# Patient Record
Sex: Male | Born: 1985 | Hispanic: Yes | Smoking: Never smoker
Health system: Southern US, Community
[De-identification: ages and names within clinical notes are randomized; demographics above are authoritative.]

## PROBLEM LIST (undated history)

## (undated) DIAGNOSIS — R29898 Other symptoms and signs involving the musculoskeletal system: Secondary | ICD-10-CM

## (undated) HISTORY — PX: NO PAST SURGERIES: SHX2092

---

## 2017-10-18 ENCOUNTER — Emergency Department (HOSPITAL_COMMUNITY): Payer: Self-pay

## 2017-10-18 ENCOUNTER — Encounter (HOSPITAL_COMMUNITY): Payer: Self-pay | Admitting: Emergency Medicine

## 2017-10-18 ENCOUNTER — Other Ambulatory Visit: Payer: Self-pay | Admitting: Gerontology

## 2017-10-18 ENCOUNTER — Ambulatory Visit (INDEPENDENT_AMBULATORY_CARE_PROVIDER_SITE_OTHER): Payer: Self-pay

## 2017-10-18 ENCOUNTER — Inpatient Hospital Stay (HOSPITAL_COMMUNITY)
Admission: EM | Admit: 2017-10-18 | Discharge: 2017-10-21 | DRG: 074 | Disposition: A | Discharge status: Home / Self Care | Payer: Self-pay | Attending: Internal Medicine | Admitting: Internal Medicine

## 2017-10-18 DIAGNOSIS — R200 Anesthesia of skin: Secondary | ICD-10-CM

## 2017-10-18 DIAGNOSIS — R202 Paresthesia of skin: Secondary | ICD-10-CM | POA: Diagnosis present

## 2017-10-18 DIAGNOSIS — M545 Low back pain: Secondary | ICD-10-CM | POA: Diagnosis present

## 2017-10-18 DIAGNOSIS — R29898 Other symptoms and signs involving the musculoskeletal system: Secondary | ICD-10-CM | POA: Diagnosis present

## 2017-10-18 DIAGNOSIS — G629 Polyneuropathy, unspecified: Principal | ICD-10-CM | POA: Diagnosis present

## 2017-10-18 DIAGNOSIS — W19XXXA Unspecified fall, initial encounter: Secondary | ICD-10-CM

## 2017-10-18 HISTORY — DX: Other symptoms and signs involving the musculoskeletal system: R29.898

## 2017-10-18 LAB — COMPREHENSIVE METABOLIC PANEL
ALK PHOS: 69 U/L (ref 38–126)
ALT: 25 U/L (ref 17–63)
ANION GAP: 8 (ref 5–15)
AST: 23 U/L (ref 15–41)
Albumin: 4.4 g/dL (ref 3.5–5.0)
BILIRUBIN TOTAL: 0.7 mg/dL (ref 0.3–1.2)
BUN: 13 mg/dL (ref 6–20)
CO2: 24 mmol/L (ref 22–32)
CREATININE: 0.9 mg/dL (ref 0.61–1.24)
Calcium: 8.9 mg/dL (ref 8.9–10.3)
Chloride: 107 mmol/L (ref 101–111)
Glucose, Bld: 96 mg/dL (ref 65–99)
Potassium: 3.8 mmol/L (ref 3.5–5.1)
Sodium: 139 mmol/L (ref 135–145)
TOTAL PROTEIN: 8.2 g/dL — AB (ref 6.5–8.1)

## 2017-10-18 LAB — CBC
HEMATOCRIT: 44.6 % (ref 39.0–52.0)
Hemoglobin: 15.8 g/dL (ref 13.0–17.0)
MCH: 31.5 pg (ref 26.0–34.0)
MCHC: 35.4 g/dL (ref 30.0–36.0)
MCV: 89 fL (ref 78.0–100.0)
Platelets: 246 10*3/uL (ref 150–400)
RBC: 5.01 MIL/uL (ref 4.22–5.81)
RDW: 13.2 % (ref 11.5–15.5)
WBC: 7.9 10*3/uL (ref 4.0–10.5)

## 2017-10-18 LAB — RAPID URINE DRUG SCREEN, HOSP PERFORMED
AMPHETAMINES: NOT DETECTED
BENZODIAZEPINES: NOT DETECTED
Barbiturates: NOT DETECTED
COCAINE: NOT DETECTED
OPIATES: NOT DETECTED
Tetrahydrocannabinol: NOT DETECTED

## 2017-10-18 LAB — SEDIMENTATION RATE: Sed Rate: 4 mm/hr (ref 0–16)

## 2017-10-18 LAB — C-REACTIVE PROTEIN

## 2017-10-18 LAB — BRAIN NATRIURETIC PEPTIDE: B NATRIURETIC PEPTIDE 5: 3.3 pg/mL (ref 0.0–100.0)

## 2017-10-18 MED ORDER — IOPAMIDOL (ISOVUE-300) INJECTION 61%
INTRAVENOUS | Status: AC
  Administered 2017-10-18: 100 mL
  Filled 2017-10-18: qty 100

## 2017-10-18 MED ORDER — ACETAMINOPHEN 650 MG RE SUPP: 650.0000 mg | RECTAL | Status: DC | PRN

## 2017-10-18 MED ORDER — GADOBENATE DIMEGLUMINE 529 MG/ML IV SOLN
20.0000 mL | Freq: Once | INTRAVENOUS | Status: AC | PRN
  Administered 2017-10-18: 20 mL via INTRAVENOUS

## 2017-10-18 MED ORDER — ACETAMINOPHEN 160 MG/5ML PO SOLN: 650.0000 mg | ORAL | Status: DC | PRN

## 2017-10-18 MED ORDER — METHOCARBAMOL 750 MG PO TABS
750.0000 mg | ORAL_TABLET | Freq: Every evening | ORAL | Status: DC | PRN
  Administered 2017-10-20: 750 mg via ORAL
  Filled 2017-10-18 (×2): qty 1

## 2017-10-18 MED ORDER — ACETAMINOPHEN 325 MG PO TABS: 650.0000 mg | ORAL_TABLET | Freq: Four times a day (QID) | ORAL | Status: DC | PRN

## 2017-10-18 MED ORDER — KETOROLAC TROMETHAMINE 15 MG/ML IJ SOLN
15.0000 mg | Freq: Once | INTRAMUSCULAR | Status: AC
  Administered 2017-10-18: 15 mg via INTRAMUSCULAR
  Filled 2017-10-18: qty 1

## 2017-10-18 MED ORDER — SENNOSIDES-DOCUSATE SODIUM 8.6-50 MG PO TABS: 1.0000 | ORAL_TABLET | Freq: Every evening | ORAL | Status: DC | PRN

## 2017-10-18 MED ORDER — ENOXAPARIN SODIUM 40 MG/0.4ML ~~LOC~~ SOLN
40.0000 mg | SUBCUTANEOUS | Status: DC
  Administered 2017-10-19 – 2017-10-20 (×2): 40 mg via SUBCUTANEOUS
  Filled 2017-10-18 (×3): qty 0.4

## 2017-10-18 MED ORDER — METHOCARBAMOL 500 MG PO TABS
1000.0000 mg | ORAL_TABLET | Freq: Once | ORAL | Status: AC
  Administered 2017-10-18: 1000 mg via ORAL
  Filled 2017-10-18: qty 2

## 2017-10-18 MED ORDER — ACETAMINOPHEN 325 MG PO TABS
650.0000 mg | ORAL_TABLET | ORAL | Status: DC | PRN
  Administered 2017-10-19 – 2017-10-20 (×3): 650 mg via ORAL
  Filled 2017-10-18 (×3): qty 2

## 2017-10-18 MED ORDER — MELOXICAM 7.5 MG PO TABS
7.5000 mg | ORAL_TABLET | Freq: Two times a day (BID) | ORAL | Status: DC | PRN
  Administered 2017-10-19: 7.5 mg via ORAL
  Filled 2017-10-18: qty 1

## 2017-10-18 NOTE — ED Notes (Signed)
Bed: WA16 Expected date:  Expected time:  Means of arrival:  Comments: Triage 9 

## 2017-10-18 NOTE — ED Notes (Signed)
This nurse and EDPA attempted to ambulate patient. Patient complained of pain and "no control" to left leg. Patient states he cannot put weight on left leg.

## 2017-10-18 NOTE — Consult Note (Addendum)
   [facility] Tele[facility]urology Consult Services  Date of Service: 10/18/17  Impression:  Acute back pain, B LE pain and severe L LE weakness - etiology unclear. The most likely localization based upon exam is L sciatic nerve or distal lumbosacral plexus. However, right hemisphere stroke, distal cervical/thoracic cord, and cauda equina cannot be ruled out. DDx myelopathy (compressive vs inflammatory vs vascular, such as dural AF fistula, which can cause transient LE weakness) vs pelvic mass vs idiopathic lumboplexitis. Symptoms came on more suddenly than would be expected for vasculitis, and GBS would not have resolution of the right leg weakness this quickly.   Recommendations:  MRI C/T spine and MRI pelvis stat to rule out compressive lesion. If above is negative, consider starting prednisone 60 mg daily pending further workup. Neuro consult EMG/NCS Consider LP based upon the results of the above.   ---------------------------------------------------------------------  CC: leg weakness  History of Present Illness:  32 yo man who was at work and he bent over and stood up, then developed painful tingling in the right leg which spread to the left. Within a few seconds, his legs became weak. Currently, the left leg is much weaker than the right. No numbness.   Diagnostic Testing: MRI L spine with only mild degenerative changes.  Vital Signs:   Vitals:   10/18/17 1838 10/18/17 1839  BP: 129/69   Pulse: 87 87  Resp: 18   Temp:    SpO2: 99% 99%     Exam:  Mental Status:  Awake, alert, oriented  Naming: Intact Repetition: Intact   Speech: fluent  Cranial Nerves:  Pupils: Equal round and reactive to light Extraocular movements: Intact in all cardinal gaze Ptosis: Absent Visual fields: Intact to finger counting Facial sensation: Intact to pin and light touch Facial movements: Intact and symmetric    Motor Exam:  No drift in B UE; strength in the right is normal. L LE is  much weaker, with weak antigravity strength in the L HF and no movement below the knee. He is unable to plantar/dorsiflex his left foot.  Tremor/Abnormal Movements:  Resting tremor: Absent Intention tremor: Absent Postural tremor: Absent  Sensory Exam:   Light touch: Intact    Coordination:   Finger to nose: Intact   Medical Decision Making:  - Extensive number of diagnosis or management options are considered above.   - Extensive amount of complex data reviewed.   - High risk of complication and/or morbidity or mortality are associated with differential diagnostic considerations above.  - There may be uncertain outcome and increased probability of prolonged functional impairment or high probability of severe prolonged functional impairment associated with some of these differential diagnosis.   Medical Data Reviewed:  1.Data reviewed include clinical labs, radiology,  Medical Tests;   2.Tests results discussed w/performing or interpreting physician;   3.Obtaining/reviewing old medical records;  4.Obtaining case history from another source;  5.Independent review of image, tracing or specimen.    Patient was informed the Neurology Consult would happen via telehealth (remote video) and consented to receiving care in this manner.

## 2017-10-18 NOTE — ED Notes (Signed)
Attempted IV x 2 blood returned twice, no IV access . Labs drawn .

## 2017-10-18 NOTE — ED Triage Notes (Signed)
Patient c/o bilateral leg pain radiating to lower back after "bedning over this morning at work." Denies changes in bowel or bladder and numbness and tingling. Movement to bilateral legs. Reports pain with movement.

## 2017-10-18 NOTE — ED Notes (Signed)
Pt speaking with Tele[facility] at this time. 

## 2017-10-18 NOTE — ED Notes (Signed)
Attempted to go into room to update vital signs but patient needed to use bathroom; will re-attempt once patient has finished urinating.

## 2017-10-18 NOTE — ED Provider Notes (Signed)
Medical screening examination/treatment/procedure(s) were conducted as a shared visit with non-physician practitioner(s) and myself.  I personally evaluated the patient during the encounter. Briefly, the patient is a 32 year old male here with new, acute onset left leg weakness, loss of reflexes, and now fasciculations.  Lumbar spine without signs of cauda equina or cord compression.  Patient with significant distal weakness of the left lower extremity, bilateral paresthesias, and loss of reflexes.  Concern for acute radiculopathy, though negative imaging raises concern for possible demyelinating or inflammatory process.  Patient does have a recent viral illness.  Given that he is unable to walk with ongoing, severe left distal lower extremity weakness, will consult neurology.   EKG Interpretation None          Ryan PollackIsaacs, Ryan Chagnon, MD 10/18/17 1807

## 2017-10-18 NOTE — Consult Note (Signed)
NEURO HOSPITALIST CONSULT NOTE   Requestig physician: Dr. Erma HeritageIsaacs  Reason for Consult: Bilateral lower extremity pain and severe LLE weakness of unclear etiology  History obtained from:  Patient and Chart     HPI:                                                                                                                                          Buckner MaltaJuan Carlos Perez Morales is an 32 y.o. male who was at work when he experienced sudden onset of painful tingling in the right leg which spread to the left after he bent over and stood up. Within a few seconds, his legs became weak. [facility] his left leg was much weaker than the right. The patient denied sensory numbness. A TeleNeurology consult was obtained.   Impression per TeleNeurology consultant was as follows: "Acute back pain, B LE pain and severe L LE weakness - etiology unclear. The most likely localization based upon exam is L sciatic nerve or distal lumbosacral plexus. However, right hemisphere stroke, distal cervical/thoracic cord, and cauda equina cannot be ruled out. DDx myelopathy (compressive vs inflammatory vs vascular, such as dural AF fistula, which can cause transient LE weakness) vs pelvic mass vs idiopathic lumboplexitis. Symptoms came on more suddenly than would be expected for vasculitis, and GBS would not have resolution of the right leg weakness this quickly."   MRI lumbar spine was unremarkable. An MRI of the cervical and thoracic spine was then ordered STAT to rule out compressive lesion, with no acute findings. CT of pelvis was then ordered to rule out lumbosacral plexus compression. An MRI was also ordered to rule out a right ACA stroke.     History reviewed. No pertinent past medical history.  History reviewed. No pertinent surgical history.  No family history on file.  Social History:  has no tobacco, alcohol, and drug history on file.  No Known Allergies  MEDICATIONS:                                                                                                                      Scheduled: . enoxaparin (LOVENOX) injection  40 mg Subcutaneous Q24H     ROS:  As per HPI  Blood pressure 136/84, pulse 83, temperature 97.9 F (36.6 C), temperature source Oral, resp. rate (!) 21, SpO2 98 %.   General Examination:                                                                                                       Physical Exam  HEENT-  Helix/AT  Lungs- Respirations unlabored Extremities- No edema  Neurological Examination Mental Status: Alert, oriented, thought content appropriate.  Speech fluent without evidence of aphasia.  Able to follow all commands without difficulty. Cranial Nerves: II: Visual fields intact. PERRL.   III,IV, VI: EOMI without nystagmus. No ptosis.  V,VII: Smile symmetric, facial temp sensation equal bilaterally VIII: Hearing intact to voice IX,X: No hypophonia XI: Symmetric shoulder shrug XII: midline tongue extension Motor: Bilateral upper ext: 5/5 proximal and distal RLE: 4 to 4+/5 with inconsistent effort. Normal bulk and tone.  LLE: 2/5 strength proximal and distal in non-dermatomal distribution, except for apparent 0/5 toe flexion/extension and ADF/APF. No difference in flexor vs extensor strength. Left quadriceps fasciculations present only when contracting the muscle, otherwise no fasciculations. TTP to left quadriceps and calf. Tone and bulk normal. Leg when laying in the bed is not externally rotated or otherwise asymmetrically positioned relative to the right.  Sensory: Temp and light touch intact x 4. No extinction.  Deep Tendon Reflexes: 2+ and symmetric upper extremities. 0 patellae and achilles bilaterally after several attempts.  Plantars: Right: downgoing  Left: downgoing Cerebellar: No  ataxia with FNF bilaterally.  Gait: Deferred   Lab Results: Basic Metabolic Panel: Recent Labs  Lab 10/18/17 2041  NA 139  K 3.8  CL 107  CO2 24  GLUCOSE 96  BUN 13  CREATININE 0.90  CALCIUM 8.9    CBC: Recent Labs  Lab 10/18/17 1810  WBC 7.9  HGB 15.8  HCT 44.6  MCV 89.0  PLT 246    Cardiac Enzymes: No results for input(s): CKTOTAL, CKMB, CKMBINDEX, TROPONINI in the last 168 hours.  Lipid Panel: No results for input(s): CHOL, TRIG, HDL, CHOLHDL, VLDL, LDLCALC in the last 168 hours.  Imaging: Dg Lumbar Spine Complete  Result Date: 10/18/2017 CLINICAL DATA:  Left lower back pain and left lower extremity numbness after falling at work yesterday. EXAM: LUMBAR SPINE - COMPLETE 4+ VIEW COMPARISON:  None in PACs FINDINGS: The lumbar vertebral bodies are preserved in height. The disc space heights are well maintained. There is no spondylolisthesis. The spinous processes are intact. No pars defects are observed. The pedicles and transverse processes are intact. The observed portions of the sacrum are normal. IMPRESSION: There is no acute or significant chronic bony abnormality of the lumbar spine. Electronically Signed   By: David  [place] M.D.   On: 10/18/2017 11:32   Mr Lumbar Spine Wo Contrast  Result Date: 10/18/2017 CLINICAL DATA:  Back pain and bilateral leg pain with numbness and tingling in both legs. EXAM: MRI LUMBAR SPINE WITHOUT CONTRAST TECHNIQUE: Multiplanar, multisequence MR imaging of the lumbar spine was performed. No intravenous contrast was administered. COMPARISON:  Lumbar  radiographs dated 10/18/2017 FINDINGS: Segmentation:  Standard. Alignment:  Physiologic. Vertebrae:  No fracture, evidence of discitis, or bone lesion. Conus medullaris and cauda equina: Conus extends to the L1-2 level. Conus and cauda equina appear normal. Paraspinal and other soft tissues: Negative. Disc levels: T10-11: Small central soft disc protrusion indents the ventral aspect of the spinal  cord, incompletely visualized on this lumbar MRI. No appreciable myelopathy. T11-12: Normal. T12-L1: Slight disc desiccation.  Otherwise normal. L1-2: Normal. L2-3: Normal. L3-4: Small disc bulges into both neural foramina, left greater than right. However, the L3 nerves exit without impingement. L4-5: Normal. L5-S1: Slight disc desiccation. Tiny central disc bulge with no neural impingement. 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 No appreciable acute abnormalities. Electronically Signed   By: Francene Boyers M.D.   On: 10/18/2017 17:33   Mr Lumbar Spine W Contrast  Result Date: 10/18/2017 CLINICAL DATA:  Low back pain after bending. Bilateral lower extremity numbness and tingling. EXAM: MRI CERVICAL SPINE WITHOUT AND WITH CONTRAST MRI THORACIC SPINE WITHOUT AND WITH CONTRAST MRI LUMBAR SPINE WITH CONTRAST TECHNIQUE: Multiplanar and multiecho pulse sequences of the cervical spine, to include the craniocervical junction, and the thoracic spine were obtained without and with intravenous contrast. Imaging of the lumbar spine was obtained with IV contrast only. CONTRAST:  20mL MULTIHANCE GADOBENATE DIMEGLUMINE 529 MG/ML IV SOLN COMPARISON:  Lumbar spine MRI earlier the same day FINDINGS: MRI CERVICAL SPINE FINDINGS Alignment: Physiologic. Vertebrae: No fracture, evidence of discitis, or bone lesion. Cord: Normal signal and morphology. Posterior Fossa, vertebral arteries, paraspinal tissues: Visualized posterior fossa is normal. Vertebral artery flow voids are preserved. No prevertebral soft tissue swelling. Disc levels: C1-C2: Normal. C2-C3: Normal disc space and facets. No spinal canal or neuroforaminal stenosis. C3-C4: Normal disc space and facets. No spinal canal or neuroforaminal stenosis. C4-C5: Normal disc space and facets. No spinal canal or neuroforaminal stenosis. C5-C6: Small central disc protrusion effaces the ventral thecal sac. No significant spinal  canal stenosis. C6-C7: Small central disc extrusion with superior migration effaces the ventral thecal sac. Mild spinal canal stenosis with flattening of the spinal cord but no signal change. Mild-to-moderate bilateral foraminal stenosis. C7-T1: Normal disc space and facets. No spinal canal or neuroforaminal stenosis. No abnormal contrast enhancement. MRI THORACIC SPINE FINDINGS Alignment: Normal Vertebrae: Normal Cord: Normal Paraspinal and other soft tissues: Normal Disc levels: There are small central disc protrusions at T6-7, T7-T8, T8-T9, T9-T10 and T10-T11. The largest of these are at T7-8 and T10-11. These efface the ventral thecal sac and indents the spinal cord but cause only mild spinal canal stenosis. The other disc levels are normal. No abnormal contrast enhancement. MRI LUMBAR SPINE FINDINGS Normal appearance of the conus medullaris and cauda equina without abnormal contrast enhancement. IMPRESSION: 1. No spinal cord or cauda equina signal abnormality or abnormal contrast enhancement. 2. Mild spinal canal stenosis at C6-7, T7-T8 and T10-11 with flattening of the ventral spinal cord but no signal change. 3. Mild disc protrusions at C5-6, T6-7, T8-9 and T9-10 without associated stenosis. Electronically Signed   By: Deatra Robinson M.D.   On: 10/18/2017 21:54   Mr Cervical Spine W Wo Contrast  Result Date: 10/18/2017 CLINICAL DATA:  Low back pain after bending. Bilateral lower extremity numbness and tingling. EXAM: MRI CERVICAL SPINE WITHOUT AND WITH CONTRAST MRI THORACIC SPINE WITHOUT AND WITH CONTRAST MRI LUMBAR SPINE WITH CONTRAST TECHNIQUE: Multiplanar and multiecho pulse sequences of the cervical spine, to include the craniocervical junction,  and the thoracic spine were obtained without and with intravenous contrast. Imaging of the lumbar spine was obtained with IV contrast only. CONTRAST:  20mL MULTIHANCE GADOBENATE DIMEGLUMINE 529 MG/ML IV SOLN COMPARISON:  Lumbar spine MRI earlier the same day  FINDINGS: MRI CERVICAL SPINE FINDINGS Alignment: Physiologic. Vertebrae: No fracture, evidence of discitis, or bone lesion. Cord: Normal signal and morphology. Posterior Fossa, vertebral arteries, paraspinal tissues: Visualized posterior fossa is normal. Vertebral artery flow voids are preserved. No prevertebral soft tissue swelling. Disc levels: C1-C2: Normal. C2-C3: Normal disc space and facets. No spinal canal or neuroforaminal stenosis. C3-C4: Normal disc space and facets. No spinal canal or neuroforaminal stenosis. C4-C5: Normal disc space and facets. No spinal canal or neuroforaminal stenosis. C5-C6: Small central disc protrusion effaces the ventral thecal sac. No significant spinal canal stenosis. C6-C7: Small central disc extrusion with superior migration effaces the ventral thecal sac. Mild spinal canal stenosis with flattening of the spinal cord but no signal change. Mild-to-moderate bilateral foraminal stenosis. C7-T1: Normal disc space and facets. No spinal canal or neuroforaminal stenosis. No abnormal contrast enhancement. MRI THORACIC SPINE FINDINGS Alignment: Normal Vertebrae: Normal Cord: Normal Paraspinal and other soft tissues: Normal Disc levels: There are small central disc protrusions at T6-7, T7-T8, T8-T9, T9-T10 and T10-T11. The largest of these are at T7-8 and T10-11. These efface the ventral thecal sac and indents the spinal cord but cause only mild spinal canal stenosis. The other disc levels are normal. No abnormal contrast enhancement. MRI LUMBAR SPINE FINDINGS Normal appearance of the conus medullaris and cauda equina without abnormal contrast enhancement. IMPRESSION: 1. No spinal cord or cauda equina signal abnormality or abnormal contrast enhancement. 2. Mild spinal canal stenosis at C6-7, T7-T8 and T10-11 with flattening of the ventral spinal cord but no signal change. 3. Mild disc protrusions at C5-6, T6-7, T8-9 and T9-10 without associated stenosis. Electronically Signed   By: Deatra Robinson M.D.   On: 10/18/2017 21:54   Mr Thoracic Spine W Wo Contrast  Result Date: 10/18/2017 CLINICAL DATA:  Low back pain after bending. Bilateral lower extremity numbness and tingling. EXAM: MRI CERVICAL SPINE WITHOUT AND WITH CONTRAST MRI THORACIC SPINE WITHOUT AND WITH CONTRAST MRI LUMBAR SPINE WITH CONTRAST TECHNIQUE: Multiplanar and multiecho pulse sequences of the cervical spine, to include the craniocervical junction, and the thoracic spine were obtained without and with intravenous contrast. Imaging of the lumbar spine was obtained with IV contrast only. CONTRAST:  20mL MULTIHANCE GADOBENATE DIMEGLUMINE 529 MG/ML IV SOLN COMPARISON:  Lumbar spine MRI earlier the same day FINDINGS: MRI CERVICAL SPINE FINDINGS Alignment: Physiologic. Vertebrae: No fracture, evidence of discitis, or bone lesion. Cord: Normal signal and morphology. Posterior Fossa, vertebral arteries, paraspinal tissues: Visualized posterior fossa is normal. Vertebral artery flow voids are preserved. No prevertebral soft tissue swelling. Disc levels: C1-C2: Normal. C2-C3: Normal disc space and facets. No spinal canal or neuroforaminal stenosis. C3-C4: Normal disc space and facets. No spinal canal or neuroforaminal stenosis. C4-C5: Normal disc space and facets. No spinal canal or neuroforaminal stenosis. C5-C6: Small central disc protrusion effaces the ventral thecal sac. No significant spinal canal stenosis. C6-C7: Small central disc extrusion with superior migration effaces the ventral thecal sac. Mild spinal canal stenosis with flattening of the spinal cord but no signal change. Mild-to-moderate bilateral foraminal stenosis. C7-T1: Normal disc space and facets. No spinal canal or neuroforaminal stenosis. No abnormal contrast enhancement. MRI THORACIC SPINE FINDINGS Alignment: Normal Vertebrae: Normal Cord: Normal Paraspinal and other soft tissues: Normal Disc levels:  There are small central disc protrusions at T6-7, T7-T8, T8-T9, T9-T10  and T10-T11. The largest of these are at T7-8 and T10-11. These efface the ventral thecal sac and indents the spinal cord but cause only mild spinal canal stenosis. The other disc levels are normal. No abnormal contrast enhancement. MRI LUMBAR SPINE FINDINGS Normal appearance of the conus medullaris and cauda equina without abnormal contrast enhancement. IMPRESSION: 1. No spinal cord or cauda equina signal abnormality or abnormal contrast enhancement. 2. Mild spinal canal stenosis at C6-7, T7-T8 and T10-11 with flattening of the ventral spinal cord but no signal change. 3. Mild disc protrusions at C5-6, T6-7, T8-9 and T9-10 without associated stenosis. Electronically Signed   By: Deatra Robinson M.D.   On: 10/18/2017 21:54   Assessment: 32 year old male with sudden onset of bilateral lower extremity weakness and left thigh pain after standing up from bent position, followed by rapid resolution of RLE weakness with persistence of LLE weakness 1. Exam shows several findings that appear possibly to represent embellishment due to inconsistencies. Lack of patellar and achilles reflexes an unusual finding, but patient has above average fitness and muscle bulk, which in young patients may be accompanied at times by difficult to elicit reflexes. Unlikely to represent GBS as onset of symptoms was sudden and RLE weakness rapidly resolved. He appears to be volitionally tensing up his left thigh muscles at the times he demonstrates the left quadriceps fasciculations; no fasciculations present at rest.  2. MRI lumbar spine was unremarkable. No cauda equina signal abnormality or abnormal contrast enhancement. MRI of the cervical and thoracic spine also with no showed no acute findings. No spinal cord signal abnormality or abnormal contrast enhancement. Mild spinal stenosis and disc protrusions noted at several levels.   3. CT abdomen and pelvis: No acute findings in the abdomen or pelvis. 4. Additional localization  discussion: Localization based upon exam is most likely not the left sciatic nerve or distal lumbosacral plexus, as the whole leg is involved circumferentially without nerve root or trunk distribution noted to the deficit. A right ACA stroke is possible and MRI brain should be obtained. Myelopathy discussed on initial TeleNeurology consult is essentially off the DDx as no cord lesions were seen. Pelvic mass ruled out with CT. Idiopathic lumboplexitis would not be expected to present with acute weakness. Agree with TeleNeurologist that symptoms came on more suddenly than would be expected for vasculitis, and GBS would not have resolution of the right leg weakness this quickly.   Recommendations: 1. MRI brain to rule out a right ACA stroke.  2. PT/OT 3. Outpatient Neurology follow up will be needed for EMG/NCS if MRI brain is negative for stroke.   Electronically signed: Dr. Caryl Pina 10/18/2017, 10:22 PM

## 2017-10-18 NOTE — H&P (Addendum)
History and Physical    Ryan Hurst HOZ:[id number] DOB: Apr 25, 1986 DOA: 10/18/2017  Referring MD/NP/PA: Coral Ceo, PA-C PCP: Patient, No Pcp Per  Patient coming from: Home  Chief Complaint: Leg pain  I have personally briefly reviewed patient's old medical records in [place]   HPI: Ryan Hurst is a 32 y.o. male without significant past medical history; who presents with complaints of leg pain and weakness.  Patient reports symptoms started around 8:15 AM while he was at work.  He had bent over to pick something up that he states was not heavy.  Upon standing he reported having a tickling pain that started at the distal aspect of his right leg and travelerd upwards.  Thereafter symptoms moved into his left leg and travel downwards.  He tried sitting down, but thereafter noted that he was unable to stand up as it felt like he would fall.  He reports being unable to bear weight on the left leg, and reported feeling as though he had no control over it.  He has never had similar symptoms like this previously in the past.  Associated symptoms include some mild lower back pain and numbness sensation in his legs.  Denies having any headache, fever, cough, shortness of breath, nausea, vomiting, abdominal pain, dysuria, urinary urgency, saddle anesthesia, recent falls/trauma, or tick bites.  He does make note of having flulike symptoms with complaints of subjective fever and headache 2-3 weeks ago.  At that time he took TheraFlu, and slept with resolution of symptoms the next day.  ED Course: Upon admission into the emergency department patient was noted to be afebrile with vital signs relatively within normal limits.  Labs included CBC, ESR, and CRP which were all within normal limits.  X-rays of the lumbar spine showed no acute abnormalities.  Neurology was consulted recommended MRI imaging and transfer to [facility] for further evaluation.  Patient received 1000 mg  of Robaxin and ketorolac 15 mg while in the ED.   TRH called to admit.  Review of Systems  Constitutional: Negative for chills, fever and malaise/fatigue.  HENT: Negative for congestion and ear discharge.   Eyes: Negative for double vision and photophobia.  Respiratory: Negative for cough and sputum production.   Cardiovascular: Negative for chest pain, palpitations and leg swelling.  Gastrointestinal: Negative for abdominal pain, diarrhea, nausea and vomiting.  Genitourinary: Negative for dysuria and frequency.  Musculoskeletal: Positive for back pain and myalgias. Negative for falls.  Skin: Negative for itching and rash.  Neurological: Positive for tingling, sensory change and weakness. Negative for loss of consciousness.  Psychiatric/Behavioral: Negative for memory loss and substance abuse.    History reviewed. No pertinent past medical history.  History reviewed. No pertinent surgical history.   has no tobacco, alcohol, and drug history on file.  No Known Allergies  History reviewed. No pertinent family history of any significant medical conditions.  Prior to Admission medications   Medication Sig Start Date End Date Taking? Authorizing Provider  meloxicam (MOBIC) 7.5 MG tablet Take 7.5 mg by mouth 2 (two) times daily as needed for pain.   Yes [provider]  methocarbamol (ROBAXIN) 750 MG tablet Take 750 mg by mouth at bedtime as needed for muscle spasms.   Yes [provider]    Physical Exam:  Constitutional: NAD, calm, comfortable Vitals:   10/18/17 1919 10/18/17 1930 10/18/17 2000 10/18/17 2154  BP: 118/65 139/79 (!) 133/58 136/84  Pulse: 83 85 78 83  Resp:    (!) 21  Temp:      TempSrc:      SpO2: 98% 98% 98% 98%   Eyes: PERRL, lids and conjunctivae normal ENMT: Mucous membranes are moist. Posterior pharynx clear of any exudate or lesions.Normal dentition.  Neck: normal, supple, no masses, no thyromegaly Respiratory: clear to auscultation  bilaterally, no wheezing, no crackles. Normal respiratory effort. No accessory muscle use.  Cardiovascular: Regular rate and rhythm, no murmurs / rubs / gallops. No extremity edema. 2+ pedal pulses. No carotid bruits.  Abdomen: no tenderness, no masses palpated. No hepatosplenomegaly. Bowel sounds positive.  Musculoskeletal: no clubbing / cyanosis. No joint deformity upper and lower extremities. Good ROM, no contractures. Normal muscle tone.  Skin: no rashes, lesions, ulcers. No induration Neurologic: CN 2-12 grossly intact. Sensation abnormal. DTR difficult to elicit in all extremities. Strength 5/5 in all 4.  Psychiatric: Normal judgment and insight. Alert and oriented x 3. Normal mood.     Labs on Admission: I have personally reviewed following labs and imaging studies  CBC: Recent Labs  Lab 10/18/17 1810  WBC 7.9  HGB 15.8  HCT 44.6  MCV 89.0  PLT 026   Basic Metabolic Panel: No results for input(s): NA, K, CL, CO2, GLUCOSE, BUN, CREATININE, CALCIUM, MG, PHOS in the last 168 hours. GFR: CrCl cannot be calculated (No order found.). Liver Function Tests: No results for input(s): AST, ALT, ALKPHOS, BILITOT, PROT, ALBUMIN in the last 168 hours. No results for input(s): LIPASE, AMYLASE in the last 168 hours. No results for input(s): AMMONIA in the last 168 hours. Coagulation Profile: No results for input(s): INR, PROTIME in the last 168 hours. Cardiac Enzymes: No results for input(s): CKTOTAL, CKMB, CKMBINDEX, TROPONINI in the last 168 hours. BNP (last 3 results) No results for input(s): PROBNP in the last 8760 hours. HbA1C: No results for input(s): HGBA1C in the last 72 hours. CBG: No results for input(s): GLUCAP in the last 168 hours. Lipid Profile: No results for input(s): CHOL, HDL, LDLCALC, TRIG, CHOLHDL, LDLDIRECT in the last 72 hours. Thyroid Function Tests: No results for input(s): TSH, T4TOTAL, FREET4, T3FREE, THYROIDAB in the last 72 hours. Anemia Panel: No  results for input(s): VITAMINB12, FOLATE, FERRITIN, TIBC, IRON, RETICCTPCT in the last 72 hours. Urine analysis: No results found for: COLORURINE, APPEARANCEUR, LABSPEC, PHURINE, GLUCOSEU, HGBUR, BILIRUBINUR, KETONESUR, PROTEINUR, UROBILINOGEN, NITRITE, LEUKOCYTESUR Sepsis Labs: No results found for this or any previous visit (from the past 240 hour(s)).   Radiological Exams on Admission: Dg Lumbar Spine Complete  Result Date: 10/18/2017 CLINICAL DATA:  Left lower back pain and left lower extremity numbness after falling at work yesterday. EXAM: LUMBAR SPINE - COMPLETE 4+ VIEW COMPARISON:  None in PACs FINDINGS: The lumbar vertebral bodies are preserved in height. The disc space heights are well maintained. There is no spondylolisthesis. The spinous processes are intact. No pars defects are observed. The pedicles and transverse processes are intact. The observed portions of the sacrum are normal. IMPRESSION: There is no acute or significant chronic bony abnormality of the lumbar spine. Electronically Signed   By: David  Martinique M.D.   On: 10/18/2017 11:32   Mr Lumbar Spine Wo Contrast  Result Date: 10/18/2017 CLINICAL DATA:  Back pain and bilateral leg pain with numbness and tingling in both legs. EXAM: MRI LUMBAR SPINE WITHOUT CONTRAST TECHNIQUE: Multiplanar, multisequence MR imaging of the lumbar spine was performed. No intravenous contrast was administered. COMPARISON:  Lumbar radiographs dated 10/18/2017 FINDINGS: Segmentation:  Standard. Alignment:  Physiologic. Vertebrae:  No fracture, evidence of discitis, or bone lesion. Conus medullaris and cauda equina: Conus extends to the L1-2 level. Conus and cauda equina appear normal. Paraspinal and other soft tissues: Negative. Disc levels: T10-11: Small central soft disc protrusion indents the ventral aspect of the spinal cord, incompletely visualized on this lumbar MRI. No appreciable myelopathy. T11-12: Normal. T12-L1: Slight disc desiccation.   Otherwise normal. L1-2: Normal. L2-3: Normal. L3-4: Small disc bulges into both neural foramina, left greater than right. However, the L3 nerves exit without impingement. L4-5: Normal. L5-S1: Slight disc desiccation. Tiny central disc bulge with no neural impingement. 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 No appreciable acute abnormalities. Electronically Signed   By: James  Maxwell M.D.   On: 10/18/2017 17:33   Mr Lumbar Spine W Contrast  Result Date: 10/18/2017 CLINICAL DATA:  Low back pain after bending. Bilateral lower extremity numbness and tingling. EXAM: MRI CERVICAL SPINE WITHOUT AND WITH CONTRAST MRI THORACIC SPINE WITHOUT AND WITH CONTRAST MRI LUMBAR SPINE WITH CONTRAST TECHNIQUE: Multiplanar and multiecho pulse sequences of the cervical spine, to include the craniocervical junction, and the thoracic spine were obtained without and with intravenous contrast. Imaging of the lumbar spine was obtained with IV contrast only. CONTRAST:  20mL MULTIHANCE GADOBENATE DIMEGLUMINE 529 MG/ML IV SOLN COMPARISON:  Lumbar spine MRI earlier the same day FINDINGS: MRI CERVICAL SPINE FINDINGS Alignment: Physiologic. Vertebrae: No fracture, evidence of discitis, or bone lesion. Cord: Normal signal and morphology. Posterior Fossa, vertebral arteries, paraspinal tissues: Visualized posterior fossa is normal. Vertebral artery flow voids are preserved. No prevertebral soft tissue swelling. Disc levels: C1-C2: Normal. C2-C3: Normal disc space and facets. No spinal canal or neuroforaminal stenosis. C3-C4: Normal disc space and facets. No spinal canal or neuroforaminal stenosis. C4-C5: Normal disc space and facets. No spinal canal or neuroforaminal stenosis. C5-C6: Small central disc protrusion effaces the ventral thecal sac. No significant spinal canal stenosis. C6-C7: Small central disc extrusion with superior migration effaces the ventral thecal sac. Mild spinal canal  stenosis with flattening of the spinal cord but no signal change. Mild-to-moderate bilateral foraminal stenosis. C7-T1: Normal disc space and facets. No spinal canal or neuroforaminal stenosis. No abnormal contrast enhancement. MRI THORACIC SPINE FINDINGS Alignment: Normal Vertebrae: Normal Cord: Normal Paraspinal and other soft tissues: Normal Disc levels: There are small central disc protrusions at T6-7, T7-T8, T8-T9, T9-T10 and T10-T11. The largest of these are at T7-8 and T10-11. These efface the ventral thecal sac and indents the spinal cord but cause only mild spinal canal stenosis. The other disc levels are normal. No abnormal contrast enhancement. MRI LUMBAR SPINE FINDINGS Normal appearance of the conus medullaris and cauda equina without abnormal contrast enhancement. IMPRESSION: 1. No spinal cord or cauda equina signal abnormality or abnormal contrast enhancement. 2. Mild spinal canal stenosis at C6-7, T7-T8 and T10-11 with flattening of the ventral spinal cord but no signal change. 3. Mild disc protrusions at C5-6, T6-7, T8-9 and T9-10 without associated stenosis. Electronically Signed   By: Kevin  Herman M.D.   On: 10/18/2017 21:54   Mr Cervical Spine W Wo Contrast  Result Date: 10/18/2017 CLINICAL DATA:  Low back pain after bending. Bilateral lower extremity numbness and tingling. EXAM: MRI CERVICAL SPINE WITHOUT AND WITH CONTRAST MRI THORACIC SPINE WITHOUT AND WITH CONTRAST MRI LUMBAR SPINE WITH CONTRAST TECHNIQUE: Multiplanar and multiecho pulse sequences of the cervical spine, to include the craniocervical junction, and the thoracic spine were obtained without and with   intravenous contrast. Imaging of the lumbar spine was obtained with IV contrast only. CONTRAST:  20mL MULTIHANCE GADOBENATE DIMEGLUMINE 529 MG/ML IV SOLN COMPARISON:  Lumbar spine MRI earlier the same day FINDINGS: MRI CERVICAL SPINE FINDINGS Alignment: Physiologic. Vertebrae: No fracture, evidence of discitis, or bone lesion.  Cord: Normal signal and morphology. Posterior Fossa, vertebral arteries, paraspinal tissues: Visualized posterior fossa is normal. Vertebral artery flow voids are preserved. No prevertebral soft tissue swelling. Disc levels: C1-C2: Normal. C2-C3: Normal disc space and facets. No spinal canal or neuroforaminal stenosis. C3-C4: Normal disc space and facets. No spinal canal or neuroforaminal stenosis. C4-C5: Normal disc space and facets. No spinal canal or neuroforaminal stenosis. C5-C6: Small central disc protrusion effaces the ventral thecal sac. No significant spinal canal stenosis. C6-C7: Small central disc extrusion with superior migration effaces the ventral thecal sac. Mild spinal canal stenosis with flattening of the spinal cord but no signal change. Mild-to-moderate bilateral foraminal stenosis. C7-T1: Normal disc space and facets. No spinal canal or neuroforaminal stenosis. No abnormal contrast enhancement. MRI THORACIC SPINE FINDINGS Alignment: Normal Vertebrae: Normal Cord: Normal Paraspinal and other soft tissues: Normal Disc levels: There are small central disc protrusions at T6-7, T7-T8, T8-T9, T9-T10 and T10-T11. The largest of these are at T7-8 and T10-11. These efface the ventral thecal sac and indents the spinal cord but cause only mild spinal canal stenosis. The other disc levels are normal. No abnormal contrast enhancement. MRI LUMBAR SPINE FINDINGS Normal appearance of the conus medullaris and cauda equina without abnormal contrast enhancement. IMPRESSION: 1. No spinal cord or cauda equina signal abnormality or abnormal contrast enhancement. 2. Mild spinal canal stenosis at C6-7, T7-T8 and T10-11 with flattening of the ventral spinal cord but no signal change. 3. Mild disc protrusions at C5-6, T6-7, T8-9 and T9-10 without associated stenosis. Electronically Signed   By: Kevin  Herman M.D.   On: 10/18/2017 21:54   Mr Thoracic Spine W Wo Contrast  Result Date: 10/18/2017 CLINICAL DATA:  Low back  pain after bending. Bilateral lower extremity numbness and tingling. EXAM: MRI CERVICAL SPINE WITHOUT AND WITH CONTRAST MRI THORACIC SPINE WITHOUT AND WITH CONTRAST MRI LUMBAR SPINE WITH CONTRAST TECHNIQUE: Multiplanar and multiecho pulse sequences of the cervical spine, to include the craniocervical junction, and the thoracic spine were obtained without and with intravenous contrast. Imaging of the lumbar spine was obtained with IV contrast only. CONTRAST:  20mL MULTIHANCE GADOBENATE DIMEGLUMINE 529 MG/ML IV SOLN COMPARISON:  Lumbar spine MRI earlier the same day FINDINGS: MRI CERVICAL SPINE FINDINGS Alignment: Physiologic. Vertebrae: No fracture, evidence of discitis, or bone lesion. Cord: Normal signal and morphology. Posterior Fossa, vertebral arteries, paraspinal tissues: Visualized posterior fossa is normal. Vertebral artery flow voids are preserved. No prevertebral soft tissue swelling. Disc levels: C1-C2: Normal. C2-C3: Normal disc space and facets. No spinal canal or neuroforaminal stenosis. C3-C4: Normal disc space and facets. No spinal canal or neuroforaminal stenosis. C4-C5: Normal disc space and facets. No spinal canal or neuroforaminal stenosis. C5-C6: Small central disc protrusion effaces the ventral thecal sac. No significant spinal canal stenosis. C6-C7: Small central disc extrusion with superior migration effaces the ventral thecal sac. Mild spinal canal stenosis with flattening of the spinal cord but no signal change. Mild-to-moderate bilateral foraminal stenosis. C7-T1: Normal disc space and facets. No spinal canal or neuroforaminal stenosis. No abnormal contrast enhancement. MRI THORACIC SPINE FINDINGS Alignment: Normal Vertebrae: Normal Cord: Normal Paraspinal and other soft tissues: Normal Disc levels: There are small central disc protrusions at T6-7, T7-T8,   T8-T9, T9-T10 and T10-T11. The largest of these are at T7-8 and T10-11. These efface the ventral thecal sac and indents the spinal cord  but cause only mild spinal canal stenosis. The other disc levels are normal. No abnormal contrast enhancement. MRI LUMBAR SPINE FINDINGS Normal appearance of the conus medullaris and cauda equina without abnormal contrast enhancement. IMPRESSION: 1. No spinal cord or cauda equina signal abnormality or abnormal contrast enhancement. 2. Mild spinal canal stenosis at C6-7, T7-T8 and T10-11 with flattening of the ventral spinal cord but no signal change. 3. Mild disc protrusions at C5-6, T6-7, T8-9 and T9-10 without associated stenosis. Electronically Signed   By: Kevin  Herman M.D.   On: 10/18/2017 21:54    X-ray of the lumbar spine: Independently reviewed.  No acute abnormalities noted  Assessment/Plan Lower extremity paresthesias and weakness: Acute.  Patient presents after bending over with acute onset of lower extremity and tingling sensation with pain, and inability to ambulate.  Patient notes short - Admit to a telemetry bed at [place] - Neurochecks - Check TSH - Follow-up MRI of the brain and CT scan of the abdomen - Physical therapy to eval and treat - Appreciate neurology consultative services, follow-up for further recommendations  Lower back pain: Acute.  No significant abnormality seen on imaging studies. - Continue home medications  DVT prophylaxis: Lovenox Code Status:full Family Communication: This plan of care with the patient and family present at bedside Disposition Plan: To be determined Consults called: Neurology Admission status: Observation  Rondell A Smith MD [facility] Pager [phone]   If 7PM-7AM, please contact night-coverage [email or web address] Password [email or web address]  10/18/2017, 10:14 PM     

## 2017-10-18 NOTE — ED Provider Notes (Addendum)
[facility]-EMERGENCY DEPT Provider Note   CSN: [id number] Arrival date & time: 10/18/17  1357     History   Chief Complaint Chief Complaint  Patient presents with  . Back Pain  . Leg Pain    HPI Ryan Hurst is a 32 y.o. Hurst.  HPI   Pt is a 32 year old Hurst who is presenting to the ED today complaining of left lower extremity weakness that began this morning while he was at work. Pt states that he bent over to pick something up and began to have pain/paresthesias that radiated from his right foot up to his leg and into his lower back. He states that pain then began to radiate down his left lower extremity and he began to have weakness/paresthesias in the left lower extremity. States that his left thigh initially had pain, however it has improved somewhat since onset. He has not been able to bear weight on the left leg and has not been able to walk on his own. States that "I feel like I have no control over my leg" and that he feels like it is numb. States that it is not painful to bear weight, and that he is just unable to hold himself up. He is still having some lower back pain, that he states is mild. He denies any pelvic pain.  States he is unable to dorsiflex or plantarflex his foot, and that he is unable to wiggle his toes. He denies any fevers, chills, uri sxs, chest pain, abd pain, sob, NVD, constipation, urinary sxs, no loss of control of bowels/bladder, no urinary retention. No saddle anesthesia. No h/o CA. No h/o IVDU. No recent unexplained weight loss.   Pt states that he had a viral illness a few weeks ago. No recent falls or trauma.   History reviewed. No pertinent past medical history.  Patient Active Problem List   Diagnosis Date Noted  . Weakness of left lower extremity 10/18/2017    History reviewed. No pertinent surgical history.     Home Medications    Prior to Admission medications   Medication Sig Start Date End Date  Taking? Authorizing Provider  meloxicam (MOBIC) 7.5 MG tablet Take 7.5 mg by mouth 2 (two) times daily as needed for pain.   Yes [provider]  methocarbamol (ROBAXIN) 750 MG tablet Take 750 mg by mouth at bedtime as needed for muscle spasms.   Yes [provider]    Family History No family history on file.  Social History Social History   Tobacco Use  . Smoking status: Not on file  Substance Use Topics  . Alcohol use: Not on file  . Drug use: Not on file     Allergies   Patient has no known allergies.   Review of Systems Review of Systems  Constitutional: Negative for chills and fever.  HENT: Negative for ear pain and sore throat.   Eyes: Negative for pain and visual disturbance.  Respiratory: Negative for cough and shortness of breath.   Cardiovascular: Negative for chest pain and leg swelling.  Gastrointestinal: Negative for abdominal pain and vomiting.  Genitourinary: Negative for dysuria and hematuria.  Musculoskeletal: Positive for back pain. Negative for arthralgias.       Left leg pain, left leg weakness, left leg numbness/paresthesia  Skin: Negative for rash.  All other systems reviewed and are negative.    Physical Exam Updated Vital Signs BP (!) 121/54   Pulse 83   Temp 97.9  F (36.6 C) (Oral)   Resp 17   SpO2 96%   Physical Exam  Constitutional: He appears well-developed and well-nourished. No distress.  HENT:  Head: Normocephalic and atraumatic.  Eyes: Conjunctivae and EOM are normal. Pupils are equal, round, and reactive to light.  Neck: Normal range of motion. Neck supple.  Cardiovascular: Normal rate, regular rhythm, normal heart sounds and intact distal pulses.  No murmur heard. Pulmonary/Chest: Effort normal and breath sounds normal. No stridor. No respiratory distress. He has no wheezes.  Abdominal: Soft. There is no tenderness.  Musculoskeletal: He exhibits no edema.  No significant midline TTP to the lumbar spine or  sciatic notches bilat. No ttp to thoracic or cervical spine. 5/5 strength to RLE. 4/5 strength to LLE with hip flexion and knee extension. Fasciculations and TTP noted to left lateral thigh. Decreased strength with knee flexion  2/5. Unable to dorsiflex or plantarflex left foot. Only able to wiggle large toe on left. Does have sensation to pain and soft touch throughout LLE. Unable to elicit patellar or achilles DTRs bilat. Babinski negative bilat. Unable to elicit triceps and biceps reflex, however brachioradialis 1+ bilat. 5/5 strength to BUE throughout with normal sensation. Normal cap refill.   Neurological: He is alert.  Mental Status:  Alert, thought content appropriate, able to give a coherent history. Speech fluent without evidence of aphasia. Able to follow 2 step commands without difficulty.  Cranial Nerves:  II:  pupils equal, round, reactive to light III,IV, VI: ptosis not present, extra-ocular motions intact bilaterally  V,VII: smile symmetric, facial light touch sensation equal VIII: hearing grossly normal to voice  X: uvula elevates symmetrically  XI: bilateral shoulder shrug symmetric and strong XII: midline tongue extension without fassiculations Gait: unable to bear weight or walk with 2 person assist  CV: 2+ radial and DP/PT pulses  Skin: Skin is warm and dry. Capillary refill takes less than 2 seconds.  Psychiatric: He has a normal mood and affect.  Nursing note and vitals reviewed.    ED Treatments / Results  Labs (all labs ordered are listed, but only abnormal results are displayed) Labs Reviewed  COMPREHENSIVE METABOLIC PANEL - Abnormal; Notable for the following components:      Result Value   Total Protein 8.2 (*)    All other components within normal limits  CBC  BRAIN NATRIURETIC PEPTIDE  SEDIMENTATION RATE  C-REACTIVE PROTEIN  RAPID URINE DRUG SCREEN, HOSP PERFORMED  HIV ANTIBODY (ROUTINE TESTING)  HEMOGLOBIN A1C  LIPID PANEL    EKG  EKG  Interpretation None       Radiology Dg Lumbar Spine Complete  Result Date: 10/18/2017 CLINICAL DATA:  Left lower back pain and left lower extremity numbness after falling at work yesterday. EXAM: LUMBAR SPINE - COMPLETE 4+ VIEW COMPARISON:  None in PACs FINDINGS: The lumbar vertebral bodies are preserved in height. The disc space heights are well maintained. There is no spondylolisthesis. The spinous processes are intact. No pars defects are observed. The pedicles and transverse processes are intact. The observed portions of the sacrum are normal. IMPRESSION: There is no acute or significant chronic bony abnormality of the lumbar spine. Electronically Signed   By: David  SwazilandJordan M.D.   On: 10/18/2017 11:32   Mr Lumbar Spine Wo Contrast  Result Date: 10/18/2017 CLINICAL DATA:  Back pain and bilateral leg pain with numbness and tingling in both legs. EXAM: MRI LUMBAR SPINE WITHOUT CONTRAST TECHNIQUE: Multiplanar, multisequence MR imaging of the lumbar spine was performed.  No intravenous contrast was administered. COMPARISON:  Lumbar radiographs dated 10/18/2017 FINDINGS: Segmentation:  Standard. Alignment:  Physiologic. Vertebrae:  No fracture, evidence of discitis, or bone lesion. Conus medullaris and cauda equina: Conus extends to the L1-2 level. Conus and cauda equina appear normal. Paraspinal and other soft tissues: Negative. Disc levels: T10-11: Small central soft disc protrusion indents the ventral aspect of the spinal cord, incompletely visualized on this lumbar MRI. No appreciable myelopathy. T11-12: Normal. T12-L1: Slight disc desiccation.  Otherwise normal. L1-2: Normal. L2-3: Normal. L3-4: Small disc bulges into both neural foramina, left greater than right. However, the L3 nerves exit without impingement. L4-5: Normal. L5-S1: Slight disc desiccation. Tiny central disc bulge with no neural impingement. 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  No appreciable acute abnormalities. Electronically Signed   By: Francene Boyers M.D.   On: 10/18/2017 17:33   Mr Lumbar Spine W Contrast  Result Date: 10/18/2017 CLINICAL DATA:  Low back pain after bending. Bilateral lower extremity numbness and tingling. EXAM: MRI CERVICAL SPINE WITHOUT AND WITH CONTRAST MRI THORACIC SPINE WITHOUT AND WITH CONTRAST MRI LUMBAR SPINE WITH CONTRAST TECHNIQUE: Multiplanar and multiecho pulse sequences of the cervical spine, to include the craniocervical junction, and the thoracic spine were obtained without and with intravenous contrast. Imaging of the lumbar spine was obtained with IV contrast only. CONTRAST:  20mL MULTIHANCE GADOBENATE DIMEGLUMINE 529 MG/ML IV SOLN COMPARISON:  Lumbar spine MRI earlier the same day FINDINGS: MRI CERVICAL SPINE FINDINGS Alignment: Physiologic. Vertebrae: No fracture, evidence of discitis, or bone lesion. Cord: Normal signal and morphology. Posterior Fossa, vertebral arteries, paraspinal tissues: Visualized posterior fossa is normal. Vertebral artery flow voids are preserved. No prevertebral soft tissue swelling. Disc levels: C1-C2: Normal. C2-C3: Normal disc space and facets. No spinal canal or neuroforaminal stenosis. C3-C4: Normal disc space and facets. No spinal canal or neuroforaminal stenosis. C4-C5: Normal disc space and facets. No spinal canal or neuroforaminal stenosis. C5-C6: Small central disc protrusion effaces the ventral thecal sac. No significant spinal canal stenosis. C6-C7: Small central disc extrusion with superior migration effaces the ventral thecal sac. Mild spinal canal stenosis with flattening of the spinal cord but no signal change. Mild-to-moderate bilateral foraminal stenosis. C7-T1: Normal disc space and facets. No spinal canal or neuroforaminal stenosis. No abnormal contrast enhancement. MRI THORACIC SPINE FINDINGS Alignment: Normal Vertebrae: Normal Cord: Normal Paraspinal and other soft tissues: Normal Disc levels:  There are small central disc protrusions at T6-7, T7-T8, T8-T9, T9-T10 and T10-T11. The largest of these are at T7-8 and T10-11. These efface the ventral thecal sac and indents the spinal cord but cause only mild spinal canal stenosis. The other disc levels are normal. No abnormal contrast enhancement. MRI LUMBAR SPINE FINDINGS Normal appearance of the conus medullaris and cauda equina without abnormal contrast enhancement. IMPRESSION: 1. No spinal cord or cauda equina signal abnormality or abnormal contrast enhancement. 2. Mild spinal canal stenosis at C6-7, T7-T8 and T10-11 with flattening of the ventral spinal cord but no signal change. 3. Mild disc protrusions at C5-6, T6-7, T8-9 and T9-10 without associated stenosis. Electronically Signed   By: Deatra Robinson M.D.   On: 10/18/2017 21:54   Mr Cervical Spine W Wo Contrast  Result Date: 10/18/2017 CLINICAL DATA:  Low back pain after bending. Bilateral lower extremity numbness and tingling. EXAM: MRI CERVICAL SPINE WITHOUT AND WITH CONTRAST MRI THORACIC SPINE WITHOUT AND WITH CONTRAST MRI LUMBAR SPINE WITH CONTRAST TECHNIQUE: Multiplanar and multiecho pulse sequences of  the cervical spine, to include the craniocervical junction, and the thoracic spine were obtained without and with intravenous contrast. Imaging of the lumbar spine was obtained with IV contrast only. CONTRAST:  20mL MULTIHANCE GADOBENATE DIMEGLUMINE 529 MG/ML IV SOLN COMPARISON:  Lumbar spine MRI earlier the same day FINDINGS: MRI CERVICAL SPINE FINDINGS Alignment: Physiologic. Vertebrae: No fracture, evidence of discitis, or bone lesion. Cord: Normal signal and morphology. Posterior Fossa, vertebral arteries, paraspinal tissues: Visualized posterior fossa is normal. Vertebral artery flow voids are preserved. No prevertebral soft tissue swelling. Disc levels: C1-C2: Normal. C2-C3: Normal disc space and facets. No spinal canal or neuroforaminal stenosis. C3-C4: Normal disc space and facets. No  spinal canal or neuroforaminal stenosis. C4-C5: Normal disc space and facets. No spinal canal or neuroforaminal stenosis. C5-C6: Small central disc protrusion effaces the ventral thecal sac. No significant spinal canal stenosis. C6-C7: Small central disc extrusion with superior migration effaces the ventral thecal sac. Mild spinal canal stenosis with flattening of the spinal cord but no signal change. Mild-to-moderate bilateral foraminal stenosis. C7-T1: Normal disc space and facets. No spinal canal or neuroforaminal stenosis. No abnormal contrast enhancement. MRI THORACIC SPINE FINDINGS Alignment: Normal Vertebrae: Normal Cord: Normal Paraspinal and other soft tissues: Normal Disc levels: There are small central disc protrusions at T6-7, T7-T8, T8-T9, T9-T10 and T10-T11. The largest of these are at T7-8 and T10-11. These efface the ventral thecal sac and indents the spinal cord but cause only mild spinal canal stenosis. The other disc levels are normal. No abnormal contrast enhancement. MRI LUMBAR SPINE FINDINGS Normal appearance of the conus medullaris and cauda equina without abnormal contrast enhancement. IMPRESSION: 1. No spinal cord or cauda equina signal abnormality or abnormal contrast enhancement. 2. Mild spinal canal stenosis at C6-7, T7-T8 and T10-11 with flattening of the ventral spinal cord but no signal change. 3. Mild disc protrusions at C5-6, T6-7, T8-9 and T9-10 without associated stenosis. Electronically Signed   By: Deatra Robinson M.D.   On: 10/18/2017 21:54   Mr Thoracic Spine W Wo Contrast  Result Date: 10/18/2017 CLINICAL DATA:  Low back pain after bending. Bilateral lower extremity numbness and tingling. EXAM: MRI CERVICAL SPINE WITHOUT AND WITH CONTRAST MRI THORACIC SPINE WITHOUT AND WITH CONTRAST MRI LUMBAR SPINE WITH CONTRAST TECHNIQUE: Multiplanar and multiecho pulse sequences of the cervical spine, to include the craniocervical junction, and the thoracic spine were obtained without and  with intravenous contrast. Imaging of the lumbar spine was obtained with IV contrast only. CONTRAST:  20mL MULTIHANCE GADOBENATE DIMEGLUMINE 529 MG/ML IV SOLN COMPARISON:  Lumbar spine MRI earlier the same day FINDINGS: MRI CERVICAL SPINE FINDINGS Alignment: Physiologic. Vertebrae: No fracture, evidence of discitis, or bone lesion. Cord: Normal signal and morphology. Posterior Fossa, vertebral arteries, paraspinal tissues: Visualized posterior fossa is normal. Vertebral artery flow voids are preserved. No prevertebral soft tissue swelling. Disc levels: C1-C2: Normal. C2-C3: Normal disc space and facets. No spinal canal or neuroforaminal stenosis. C3-C4: Normal disc space and facets. No spinal canal or neuroforaminal stenosis. C4-C5: Normal disc space and facets. No spinal canal or neuroforaminal stenosis. C5-C6: Small central disc protrusion effaces the ventral thecal sac. No significant spinal canal stenosis. C6-C7: Small central disc extrusion with superior migration effaces the ventral thecal sac. Mild spinal canal stenosis with flattening of the spinal cord but no signal change. Mild-to-moderate bilateral foraminal stenosis. C7-T1: Normal disc space and facets. No spinal canal or neuroforaminal stenosis. No abnormal contrast enhancement. MRI THORACIC SPINE FINDINGS Alignment: Normal Vertebrae: Normal Cord: Normal  Paraspinal and other soft tissues: Normal Disc levels: There are small central disc protrusions at T6-7, T7-T8, T8-T9, T9-T10 and T10-T11. The largest of these are at T7-8 and T10-11. These efface the ventral thecal sac and indents the spinal cord but cause only mild spinal canal stenosis. The other disc levels are normal. No abnormal contrast enhancement. MRI LUMBAR SPINE FINDINGS Normal appearance of the conus medullaris and cauda equina without abnormal contrast enhancement. IMPRESSION: 1. No spinal cord or cauda equina signal abnormality or abnormal contrast enhancement. 2. Mild spinal canal  stenosis at C6-7, T7-T8 and T10-11 with flattening of the ventral spinal cord but no signal change. 3. Mild disc protrusions at C5-6, T6-7, T8-9 and T9-10 without associated stenosis. Electronically Signed   By: Deatra Robinson M.D.   On: 10/18/2017 21:54   Ct Abdomen Pelvis W Contrast  Result Date: 10/18/2017 CLINICAL DATA:  Acute back pain, nausea, vomiting EXAM: CT ABDOMEN AND PELVIS WITH CONTRAST TECHNIQUE: Multidetector CT imaging of the abdomen and pelvis was performed using the standard protocol following bolus administration of intravenous contrast. CONTRAST:  ISOVUE-300 IOPAMIDOL (ISOVUE-300) INJECTION 61% COMPARISON:  None. FINDINGS: Lower chest: Lung bases are clear. No effusions. Heart is normal size. Hepatobiliary: Fatty infiltration of the liver. No focal abnormality or biliary ductal dilatation. Gallbladder unremarkable. Pancreas: No focal abnormality or ductal dilatation. Spleen: No focal abnormality.  Normal size. Adrenals/Urinary Tract: No adrenal abnormality. No focal renal abnormality. No stones or hydronephrosis. Urinary bladder is unremarkable. Stomach/Bowel: Stomach, large and small bowel grossly unremarkable. Appendix is normal. Vascular/Lymphatic: No evidence of aneurysm or adenopathy. Reproductive: No visible focal abnormality. Other: No free fluid or free air. Musculoskeletal: No acute bony abnormality. IMPRESSION: Mild fatty infiltration of the liver. No acute findings in the abdomen or pelvis. Electronically Signed   By: Charlett Nose M.D.   On: 10/18/2017 22:50    Procedures Procedures (including critical care time)  Medications Ordered in ED Medications  acetaminophen (TYLENOL) tablet 650 mg (not administered)    Or  acetaminophen (TYLENOL) solution 650 mg (not administered)    Or  acetaminophen (TYLENOL) suppository 650 mg (not administered)  senna-docusate (Senokot-S) tablet 1 tablet (not administered)  enoxaparin (LOVENOX) injection 40 mg (not administered)    meloxicam (MOBIC) tablet 7.5 mg (not administered)  methocarbamol (ROBAXIN) tablet 750 mg (not administered)  ketorolac (TORADOL) 15 MG/ML injection 15 mg (15 mg Intramuscular Given 10/18/17 1824)  methocarbamol (ROBAXIN) tablet 1,000 mg (1,000 mg Oral Given 10/18/17 1825)  gadobenate dimeglumine (MULTIHANCE) injection 20 mL (20 mLs Intravenous Contrast Given 10/18/17 2111)  iopamidol (ISOVUE-300) 61 % injection (100 mLs  Contrast Given 10/18/17 2239)     Initial Impression / Assessment and Plan / ED Course  I have reviewed the triage vital signs and the nursing notes.  Pertinent labs & imaging results that were available during my care of the patient were reviewed by me and considered in my medical decision making (see chart for details).    Discussed pt presentation and exam findings with Dr. Ranae Palms, who agrees with the plan to MRI.  Discussed pt presentation and exam findings with Dr. Erma Heritage, who evaluated the patient and recommends consulting tele-neuro as well as ordering basic labs, sed rate, CRP.  Discussed pt presentation and exam findings with Dr. Erma Heritage, who evaluated the patient and recommends consulting teleneurology.  Consulted with Dr. Suzie Portela from teleneuro who evaluated the patient and recommends completing an MRI of the cervical and thoracic spine.  Also states that patient may  need MRI of pelvis as well as EMG, however he recommended spinal imaging first. recommended consulting neuro hospitalist for admission.  Consulted Dr. Otelia Limes, with neurology hospitalist service, who recommended MRI of cervical spine with and without contrast, MRI thoracic spine with and without contrast, and MRI lumbar spine with contrast.  States that imaging of pelvis can wait until results of MRI.  Recommended that CT pelvis without contrast can be completed after MRI of C/L/T spine.   Consulted hospitalist, Dr. Katrinka Blazing about pt, and discussed plan for admit to [facility] and plan for transfer to [facility]  after MRIs completed.   Discussed C/T/L MRI results with Dr. Otelia Limes, he recommends CT pelvis w/o contrast and MR brain w/o with plan to still transfer to [facility].  Dr. Erma Heritage spoke with hospitalist, Dr. Katrinka Blazing and informed of plan for admit and transfer. He recommended ct abd/pelvis with contrast rather than pelvic imaging w/o contrast.   Final Clinical Impressions(s) / ED Diagnoses   Final diagnoses:  Weakness of left lower extremity  Acute midline low back pain, with sciatica presence unspecified   32 y/o Hurst with no PMHx presenting with 1 day h/o lower back pain, left thigh pain, left lower extremity weakness, and inability to bear weight. VSS, afebrile, nontoxic. On exam pt has decreased strength with left hip flexion and left knee flexion. He is unable to dorsiflex or plantarflex left foot, and has decreased ability to move toes on the left foot. Sensation is intact to soft touch and painful stimuli. Unable to elicit patellar and achilles DTRs. Strength/sensation normal to BUE. Cranial nerves 2-12 intact. Labwork grossly reassuring. Given weakness in LLE, MR without contrast ordered of lumbar spine which was negative for acute abnormalities. Teleneuro was consulted and advised further imaging as mentioned above. Neuro hospitalist was also consulted and recommended imaging of C/T/L spine prior to pelvic imaging. MR of C/T/L spine showed evidence of cauda equina or abnormal contrast enhancement. No evidence of compression lesion.  Result communicated with Dr. Otelia Limes who recommended further imaging with MR brain to rule out ACA stroke and CT pelvis to rule out lumbosacral plexus compression, which is currently pending. Also recommended admit/transfer to [facility] for further workup. Pt admitted to hospitalist at [facility] with plan for transfer to [place].  ED Discharge Orders    None       Karrie Meres, PA-C 10/18/17 2327    Karrie Meres, PA-C 10/18/17 2333    Shaune Pollack,  MD 10/19/17 1128

## 2017-10-18 NOTE — ED Notes (Signed)
Patient transported to MRI 

## 2017-10-19 ENCOUNTER — Other Ambulatory Visit: Payer: Self-pay

## 2017-10-19 ENCOUNTER — Encounter (HOSPITAL_COMMUNITY): Payer: Self-pay | Admitting: Internal Medicine

## 2017-10-19 ENCOUNTER — Observation Stay (HOSPITAL_COMMUNITY): Payer: Self-pay

## 2017-10-19 DIAGNOSIS — R202 Paresthesia of skin: Secondary | ICD-10-CM | POA: Diagnosis present

## 2017-10-19 DIAGNOSIS — R29898 Other symptoms and signs involving the musculoskeletal system: Secondary | ICD-10-CM

## 2017-10-19 HISTORY — DX: Other symptoms and signs involving the musculoskeletal system: R29.898

## 2017-10-19 NOTE — ED Notes (Signed)
ED TO INPATIENT HANDOFF REPORT  Name/Age/Gender Ryan Hurst 32 y.o. male  Code Status    Code Status Orders  (From admission, onward)        Start     Ordered   10/18/17 2315  Full code  Continuous     10/18/17 2318    Code Status History    Date Active Date Inactive Code Status Order ID Comments User Context   This patient has a current code status but no historical code status.    Advance Directive Documentation     Most Recent Value  Type of Advance Directive  Living will, Healthcare Power of Attorney  Pre-existing out of facility DNR order (yellow form or pink MOST form)  No data  "MOST" Form in Place?  No data      Home/SNF/Other Home  Chief Complaint pain in legs  Level of Care/Admitting Diagnosis ED Disposition    ED Disposition Condition [facility]: [place] [100100]  Level of Care: Telemetry [5]  Diagnosis: Weakness of left lower extremity [1610960]  Admitting Physician: Norval Morton [[id number]]  Attending Physician: Norval Morton [[id number]]  PT Class (Do Not Modify): Observation [104]  PT Acc Code (Do Not Modify): Observation [10022]       Medical History History reviewed. No pertinent past medical history.  Allergies No Known Allergies  IV Location/Drains/Wounds Patient Lines/Drains/Airways Status   Active Line/Drains/Airways    Name:   Placement date:   Placement time:   Site:   Days:   Peripheral IV 10/18/17 Left Antecubital   10/18/17    1906    Antecubital   1          Labs/Imaging Results for orders placed or performed during the hospital encounter of 10/18/17 (from the past 48 hour(s))  CBC     Status: None   Collection Time: 10/18/17  6:10 PM  Result Value Ref Range   WBC 7.9 4.0 - 10.5 K/uL   RBC 5.01 4.22 - 5.81 MIL/uL   Hemoglobin 15.8 13.0 - 17.0 g/dL   HCT 44.6 39.0 - 52.0 %   MCV 89.0 78.0 - 100.0 fL   MCH 31.5 26.0 - 34.0 pg   MCHC 35.4 30.0 - 36.0 g/dL    RDW 13.2 11.5 - 15.5 %   Platelets 246 150 - 400 K/uL    Comment: Performed at [facility], [place] [address] [place], [place] [address]  Brain natriuretic peptide     Status: None   Collection Time: 10/18/17  6:10 PM  Result Value Ref Range   B Natriuretic Peptide 3.3 0.0 - 100.0 pg/mL    Comment: Performed at [facility], [place] [address], [place], [place] [address]  Sedimentation rate     Status: None   Collection Time: 10/18/17  6:10 PM  Result Value Ref Range   Sed Rate 4 0 - 16 mm/hr    Comment: Performed at [facility], [place] [address] [place], [place] [address]  C-reactive protein     Status: None   Collection Time: 10/18/17  6:16 PM  Result Value Ref Range   CRP <0.8 <1.0 mg/dL    Comment: Performed at [facility], [place] [address] [place], [place] [address]  Comprehensive metabolic panel     Status: Abnormal   Collection Time: 10/18/17  8:41 PM  Result Value Ref Range   Sodium 139 135 -  145 mmol/L   Potassium 3.8 3.5 - 5.1 mmol/L   Chloride 107 101 - 111 mmol/L   CO2 24 22 - 32 mmol/L   Glucose, Bld 96 65 - 99 mg/dL   BUN 13 6 - 20 mg/dL   Creatinine, Ser 0.90 0.61 - 1.24 mg/dL   Calcium 8.9 8.9 - 10.3 mg/dL   Total Protein 8.2 (H) 6.5 - 8.1 g/dL   Albumin 4.4 3.5 - 5.0 g/dL   AST 23 15 - 41 U/L   ALT 25 17 - 63 U/L   Alkaline Phosphatase 69 38 - 126 U/L   Total Bilirubin 0.7 0.3 - 1.2 mg/dL   GFR calc non Af Amer >60 >60 mL/min   GFR calc Af Amer >60 >60 mL/min    Comment: (NOTE) The eGFR has been calculated using the CKD EPI equation. This calculation has not been validated in all clinical situations. eGFR's persistently <60 mL/min signify possible Chronic Kidney Disease.    Anion gap 8 5 - 15    Comment: Performed at [facility], [place] [address], [place], [place] [address]  Urine rapid drug screen (hosp performed)     Status: None   Collection Time: 10/18/17  9:16 PM  Result  Value Ref Range   Opiates NONE DETECTED NONE DETECTED   Cocaine NONE DETECTED NONE DETECTED   Benzodiazepines NONE DETECTED NONE DETECTED   Amphetamines NONE DETECTED NONE DETECTED   Tetrahydrocannabinol NONE DETECTED NONE DETECTED   Barbiturates NONE DETECTED NONE DETECTED    Comment: (NOTE) DRUG SCREEN FOR MEDICAL PURPOSES ONLY.  IF CONFIRMATION IS NEEDED FOR ANY PURPOSE, NOTIFY LAB WITHIN 5 DAYS. LOWEST DETECTABLE LIMITS FOR URINE DRUG SCREEN Drug Class                     Cutoff (ng/mL) Amphetamine and metabolites    1000 Barbiturate and metabolites    200 Benzodiazepine                 169 Tricyclics and metabolites     300 Opiates and metabolites        300 Cocaine and metabolites        300 THC                            50 Performed at [facility], [place] [address] [place], [place] [address]    Dg Lumbar Spine Complete  Result Date: 10/18/2017 CLINICAL DATA:  Left lower back pain and left lower extremity numbness after falling at work yesterday. EXAM: LUMBAR SPINE - COMPLETE 4+ VIEW COMPARISON:  None in PACs FINDINGS: The lumbar vertebral bodies are preserved in height. The disc space heights are well maintained. There is no spondylolisthesis. The spinous processes are intact. No pars defects are observed. The pedicles and transverse processes are intact. The observed portions of the sacrum are normal. IMPRESSION: There is no acute or significant chronic bony abnormality of the lumbar spine. Electronically Signed   By: David  Martinique M.D.   On: 10/18/2017 11:32   Mr Lumbar Spine Wo Contrast  Result Date: 10/18/2017 CLINICAL DATA:  Back pain and bilateral leg pain with numbness and tingling in both legs. EXAM: MRI LUMBAR SPINE WITHOUT CONTRAST TECHNIQUE: Multiplanar, multisequence MR imaging of the lumbar spine was performed. No intravenous contrast was administered. COMPARISON:  Lumbar radiographs dated 10/18/2017 FINDINGS: Segmentation:  Standard. Alignment:   Physiologic. Vertebrae:  No fracture, evidence  of discitis, or bone lesion. Conus medullaris and cauda equina: Conus extends to the L1-2 level. Conus and cauda equina appear normal. Paraspinal and other soft tissues: Negative. Disc levels: T10-11: Small central soft disc protrusion indents the ventral aspect of the spinal cord, incompletely visualized on this lumbar MRI. No appreciable myelopathy. T11-12: Normal. T12-L1: Slight disc desiccation.  Otherwise normal. L1-2: Normal. L2-3: Normal. L3-4: Small disc bulges into both neural foramina, left greater than right. However, the L3 nerves exit without impingement. L4-5: Normal. L5-S1: Slight disc desiccation. Tiny central disc bulge with no neural impingement. 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 No appreciable acute abnormalities. Electronically Signed   By: Lorriane Shire M.D.   On: 10/18/2017 17:33   Mr Lumbar Spine W Contrast  Result Date: 10/18/2017 CLINICAL DATA:  Low back pain after bending. Bilateral lower extremity numbness and tingling. EXAM: MRI CERVICAL SPINE WITHOUT AND WITH CONTRAST MRI THORACIC SPINE WITHOUT AND WITH CONTRAST MRI LUMBAR SPINE WITH CONTRAST TECHNIQUE: Multiplanar and multiecho pulse sequences of the cervical spine, to include the craniocervical junction, and the thoracic spine were obtained without and with intravenous contrast. Imaging of the lumbar spine was obtained with IV contrast only. CONTRAST:  83m MULTIHANCE GADOBENATE DIMEGLUMINE 529 MG/ML IV SOLN COMPARISON:  Lumbar spine MRI earlier the same day FINDINGS: MRI CERVICAL SPINE FINDINGS Alignment: Physiologic. Vertebrae: No fracture, evidence of discitis, or bone lesion. Cord: Normal signal and morphology. Posterior Fossa, vertebral arteries, paraspinal tissues: Visualized posterior fossa is normal. Vertebral artery flow voids are preserved. No prevertebral soft tissue swelling. Disc levels: C1-C2: Normal. C2-C3:  Normal disc space and facets. No spinal canal or neuroforaminal stenosis. C3-C4: Normal disc space and facets. No spinal canal or neuroforaminal stenosis. C4-C5: Normal disc space and facets. No spinal canal or neuroforaminal stenosis. C5-C6: Small central disc protrusion effaces the ventral thecal sac. No significant spinal canal stenosis. C6-C7: Small central disc extrusion with superior migration effaces the ventral thecal sac. Mild spinal canal stenosis with flattening of the spinal cord but no signal change. Mild-to-moderate bilateral foraminal stenosis. C7-T1: Normal disc space and facets. No spinal canal or neuroforaminal stenosis. No abnormal contrast enhancement. MRI THORACIC SPINE FINDINGS Alignment: Normal Vertebrae: Normal Cord: Normal Paraspinal and other soft tissues: Normal Disc levels: There are small central disc protrusions at T6-7, T7-T8, T8-T9, T9-T10 and T10-T11. The largest of these are at T7-8 and T10-11. These efface the ventral thecal sac and indents the spinal cord but cause only mild spinal canal stenosis. The other disc levels are normal. No abnormal contrast enhancement. MRI LUMBAR SPINE FINDINGS Normal appearance of the conus medullaris and cauda equina without abnormal contrast enhancement. IMPRESSION: 1. No spinal cord or cauda equina signal abnormality or abnormal contrast enhancement. 2. Mild spinal canal stenosis at C6-7, T7-T8 and T10-11 with flattening of the ventral spinal cord but no signal change. 3. Mild disc protrusions at C5-6, T6-7, T8-9 and T9-10 without associated stenosis. Electronically Signed   By: KUlyses JarredM.D.   On: 10/18/2017 21:54   Mr Cervical Spine W Wo Contrast  Result Date: 10/18/2017 CLINICAL DATA:  Low back pain after bending. Bilateral lower extremity numbness and tingling. EXAM: MRI CERVICAL SPINE WITHOUT AND WITH CONTRAST MRI THORACIC SPINE WITHOUT AND WITH CONTRAST MRI LUMBAR SPINE WITH CONTRAST TECHNIQUE: Multiplanar and multiecho pulse  sequences of the cervical spine, to include the craniocervical junction, and the thoracic spine were obtained without and with intravenous contrast. Imaging of the lumbar  spine was obtained with IV contrast only. CONTRAST:  38m MULTIHANCE GADOBENATE DIMEGLUMINE 529 MG/ML IV SOLN COMPARISON:  Lumbar spine MRI earlier the same day FINDINGS: MRI CERVICAL SPINE FINDINGS Alignment: Physiologic. Vertebrae: No fracture, evidence of discitis, or bone lesion. Cord: Normal signal and morphology. Posterior Fossa, vertebral arteries, paraspinal tissues: Visualized posterior fossa is normal. Vertebral artery flow voids are preserved. No prevertebral soft tissue swelling. Disc levels: C1-C2: Normal. C2-C3: Normal disc space and facets. No spinal canal or neuroforaminal stenosis. C3-C4: Normal disc space and facets. No spinal canal or neuroforaminal stenosis. C4-C5: Normal disc space and facets. No spinal canal or neuroforaminal stenosis. C5-C6: Small central disc protrusion effaces the ventral thecal sac. No significant spinal canal stenosis. C6-C7: Small central disc extrusion with superior migration effaces the ventral thecal sac. Mild spinal canal stenosis with flattening of the spinal cord but no signal change. Mild-to-moderate bilateral foraminal stenosis. C7-T1: Normal disc space and facets. No spinal canal or neuroforaminal stenosis. No abnormal contrast enhancement. MRI THORACIC SPINE FINDINGS Alignment: Normal Vertebrae: Normal Cord: Normal Paraspinal and other soft tissues: Normal Disc levels: There are small central disc protrusions at T6-7, T7-T8, T8-T9, T9-T10 and T10-T11. The largest of these are at T7-8 and T10-11. These efface the ventral thecal sac and indents the spinal cord but cause only mild spinal canal stenosis. The other disc levels are normal. No abnormal contrast enhancement. MRI LUMBAR SPINE FINDINGS Normal appearance of the conus medullaris and cauda equina without abnormal contrast enhancement.  IMPRESSION: 1. No spinal cord or cauda equina signal abnormality or abnormal contrast enhancement. 2. Mild spinal canal stenosis at C6-7, T7-T8 and T10-11 with flattening of the ventral spinal cord but no signal change. 3. Mild disc protrusions at C5-6, T6-7, T8-9 and T9-10 without associated stenosis. Electronically Signed   By: KUlyses JarredM.D.   On: 10/18/2017 21:54   Mr Thoracic Spine W Wo Contrast  Result Date: 10/18/2017 CLINICAL DATA:  Low back pain after bending. Bilateral lower extremity numbness and tingling. EXAM: MRI CERVICAL SPINE WITHOUT AND WITH CONTRAST MRI THORACIC SPINE WITHOUT AND WITH CONTRAST MRI LUMBAR SPINE WITH CONTRAST TECHNIQUE: Multiplanar and multiecho pulse sequences of the cervical spine, to include the craniocervical junction, and the thoracic spine were obtained without and with intravenous contrast. Imaging of the lumbar spine was obtained with IV contrast only. CONTRAST:  249mMULTIHANCE GADOBENATE DIMEGLUMINE 529 MG/ML IV SOLN COMPARISON:  Lumbar spine MRI earlier the same day FINDINGS: MRI CERVICAL SPINE FINDINGS Alignment: Physiologic. Vertebrae: No fracture, evidence of discitis, or bone lesion. Cord: Normal signal and morphology. Posterior Fossa, vertebral arteries, paraspinal tissues: Visualized posterior fossa is normal. Vertebral artery flow voids are preserved. No prevertebral soft tissue swelling. Disc levels: C1-C2: Normal. C2-C3: Normal disc space and facets. No spinal canal or neuroforaminal stenosis. C3-C4: Normal disc space and facets. No spinal canal or neuroforaminal stenosis. C4-C5: Normal disc space and facets. No spinal canal or neuroforaminal stenosis. C5-C6: Small central disc protrusion effaces the ventral thecal sac. No significant spinal canal stenosis. C6-C7: Small central disc extrusion with superior migration effaces the ventral thecal sac. Mild spinal canal stenosis with flattening of the spinal cord but no signal change. Mild-to-moderate bilateral  foraminal stenosis. C7-T1: Normal disc space and facets. No spinal canal or neuroforaminal stenosis. No abnormal contrast enhancement. MRI THORACIC SPINE FINDINGS Alignment: Normal Vertebrae: Normal Cord: Normal Paraspinal and other soft tissues: Normal Disc levels: There are small central disc protrusions at T6-7, T7-T8, T8-T9, T9-T10 and T10-T11. The largest  of these are at T7-8 and T10-11. These efface the ventral thecal sac and indents the spinal cord but cause only mild spinal canal stenosis. The other disc levels are normal. No abnormal contrast enhancement. MRI LUMBAR SPINE FINDINGS Normal appearance of the conus medullaris and cauda equina without abnormal contrast enhancement. IMPRESSION: 1. No spinal cord or cauda equina signal abnormality or abnormal contrast enhancement. 2. Mild spinal canal stenosis at C6-7, T7-T8 and T10-11 with flattening of the ventral spinal cord but no signal change. 3. Mild disc protrusions at C5-6, T6-7, T8-9 and T9-10 without associated stenosis. Electronically Signed   By: Ulyses Jarred M.D.   On: 10/18/2017 21:54   Ct Abdomen Pelvis W Contrast  Result Date: 10/18/2017 CLINICAL DATA:  Acute back pain, nausea, vomiting EXAM: CT ABDOMEN AND PELVIS WITH CONTRAST TECHNIQUE: Multidetector CT imaging of the abdomen and pelvis was performed using the standard protocol following bolus administration of intravenous contrast. CONTRAST:  13m ISOVUE-300 IOPAMIDOL (ISOVUE-300) INJECTION 61% COMPARISON:  None. FINDINGS: Lower chest: Lung bases are clear. No effusions. Heart is normal size. Hepatobiliary: Fatty infiltration of the liver. No focal abnormality or biliary ductal dilatation. Gallbladder unremarkable. Pancreas: No focal abnormality or ductal dilatation. Spleen: No focal abnormality.  Normal size. Adrenals/Urinary Tract: No adrenal abnormality. No focal renal abnormality. No stones or hydronephrosis. Urinary bladder is unremarkable. Stomach/Bowel: Stomach, large and small  bowel grossly unremarkable. Appendix is normal. Vascular/Lymphatic: No evidence of aneurysm or adenopathy. Reproductive: No visible focal abnormality. Other: No free fluid or free air. Musculoskeletal: No acute bony abnormality. IMPRESSION: Mild fatty infiltration of the liver. No acute findings in the abdomen or pelvis. Electronically Signed   By: KRolm BaptiseM.D.   On: 10/18/2017 22:50    Pending Labs Unresulted Labs (From admission, onward)   Start     Ordered   10/19/17 0500  HIV antibody (Routine Testing)  Tomorrow morning,   R     10/18/17 2318   10/19/17 0500  Hemoglobin A1c  Tomorrow morning,   R     10/18/17 2318   10/19/17 0500  Lipid panel  Tomorrow morning,   R    Comments:  Fasting    10/18/17 2318   10/19/17 0500  Magnesium  Tomorrow morning,   R     10/19/17 0002   10/19/17 0003  TSH  Add-on,   R     10/19/17 0002      Vitals/Pain Today's Vitals   10/18/17 2000 10/18/17 2154 10/18/17 2200 10/18/17 2258  BP: (!) 133/58 136/84 135/90 (!) 121/54  Pulse: 78 83 97 83  Resp:  (!) _0 Temp:      TempSrc:      SpO2: 98% 98% 99% 96%    Isolation Precautions No active isolations  Medications Medications  acetaminophen (TYLENOL) tablet 650 mg (not administered)    Or  acetaminophen (TYLENOL) solution 650 mg (not administered)    Or  acetaminophen (TYLENOL) suppository 650 mg (not administered)  senna-docusate (Senokot-S) tablet 1 tablet (not administered)  enoxaparin (LOVENOX) injection 40 mg (not administered)  meloxicam (MOBIC) tablet 7.5 mg (not administered)  methocarbamol (ROBAXIN) tablet 750 mg (not administered)  ketorolac (TORADOL) 15 MG/ML injection 15 mg (15 mg Intramuscular Given 10/18/17 1824)  methocarbamol (ROBAXIN) tablet 1,000 mg (1,000 mg Oral Given 10/18/17 1825)  gadobenate dimeglumine (MULTIHANCE) injection 20 mL (20 mLs Intravenous Contrast Given 10/18/17 2111)  iopamidol (ISOVUE-300) 61 % injection (100 mLs  Contrast Given 10/18/17 2239)  Mobility non-ambulatory

## 2017-10-19 NOTE — Progress Notes (Signed)
PROGRESS NOTE  Ryan Hurst ZOX:[id number] DOB: November 24, 1985 DOA: 10/18/2017 PCP: Patient, No Pcp Per  HPI/Recap of past 24 hours: Ryan Hurst is a 32 y.o. male without significant past medical history; who presents with complaints of leg pain and weakness for 1 day while at work. Patient bent over to lift an object that was not heavy.  Upon standing he reported having pain that started on the lateral aspect of L thigh, left leg and also his back. Thereafter, reports being unable to bear weight on the left leg with associated numbness sensation. Denied having any headache, fever, cough, shortness of breath, nausea, vomiting, abdominal pain, dysuria, urinary urgency, saddle anesthesia, recent falls/trauma, or tick bites. Pt works in a [employer] where he sometimes lifts heavy objects as well as lifts weight at [employer] (30lbs). Pt admitted for further management  Today, pt still reported L thigh pain on raising L leg as well as weakness, denies any bowel/bladder incontinence, no back pain currently, denies chest pain, SOB, N/V/D/C, fever/chills, abdominal pain.   Assessment/Plan: Principal Problem:   Weakness of left lower extremity Active Problems:   Paresthesias  LLE weakness & thigh pain Still ongoing Etiology currently unknown, ??Sciatica MRI cervical/thoracic spine: Mild spinal canal stenosis at C6-7, T7-T8 and T10-11 with flattening of the ventral spinal cord but no signal change. Mild disc protrusions at C5-6, T6-7, T8-9 and T9-10 without associated stenosis. No other abnormalities MRI lumbar: No acute findings MRI brain: No acute findings Neurology on board Pain management PT/OT Outpt neurology follow up rec for EMG/NCS as per neurology    Code Status: Full  Family Communication: None at bedside  Disposition Plan: Likely 10/20/17   Consultants:  Neurology  Procedures:  None  Antimicrobials:  None   DVT prophylaxis:  Lovenox   Objective: Vitals:   10/19/17 0547 10/19/17 0808 10/19/17 0946 10/19/17 1232  BP: 134/67 129/89 (!) 149/76 134/83  Pulse: 72 80 85 78  Resp: 19 20 20 20   Temp: 98.2 F (36.8 C) 98.3 F (36.8 C) 98.2 F (36.8 C) (!) 97.5 F (36.4 C)  TempSrc: Oral Oral Oral Oral  SpO2: 98% 99% 100% 99%    Intake/Output Summary (Last 24 hours) at 10/19/2017 1312 Last data filed at 10/19/2017 0200 Gross per 24 hour  Intake 120 ml  Output -  Net 120 ml   There were no vitals filed for this visit.  Exam:   General: NAD  Cardiovascular: S1, S2 present  Respiratory: CTA   Abdomen: Soft, NT, ND, BS+   Musculoskeletal: No pedal edema  Skin: Normal  Psychiatry: Normal mood  Neuro: BUE 5/5, RLE 4+/5, LLE 2/5, pain on lat aspect of thigh   Data Reviewed: CBC: Recent Labs  Lab 10/18/17 1810  WBC 7.9  HGB 15.8  HCT 44.6  MCV 89.0  PLT 246   Basic Metabolic Panel: Recent Labs  Lab 10/18/17 2041  NA 139  K 3.8  CL 107  CO2 24  GLUCOSE 96  BUN 13  CREATININE 0.90  CALCIUM 8.9   GFR: CrCl cannot be calculated (Unknown ideal weight.). Liver Function Tests: Recent Labs  Lab 10/18/17 2041  AST 23  ALT 25  ALKPHOS 69  BILITOT 0.7  PROT 8.2*  ALBUMIN 4.4   No results for input(s): LIPASE, AMYLASE in the last 168 hours. No results for input(s): AMMONIA in the last 168 hours. Coagulation Profile: No results for input(s): INR, PROTIME in the last 168 hours.  Cardiac Enzymes: No results for input(s): CKTOTAL, CKMB, CKMBINDEX, TROPONINI in the last 168 hours. BNP (last 3 results) No results for input(s): PROBNP in the last 8760 hours. HbA1C: No results for input(s): HGBA1C in the last 72 hours. CBG: No results for input(s): GLUCAP in the last 168 hours. Lipid Profile: No results for input(s): CHOL, HDL, LDLCALC, TRIG, CHOLHDL, LDLDIRECT in the last 72 hours. Thyroid Function Tests: No results for input(s): TSH, T4TOTAL, FREET4, T3FREE, THYROIDAB in the  last 72 hours. Anemia Panel: No results for input(s): VITAMINB12, FOLATE, FERRITIN, TIBC, IRON, RETICCTPCT in the last 72 hours. Urine analysis: No results found for: COLORURINE, APPEARANCEUR, LABSPEC, PHURINE, GLUCOSEU, HGBUR, BILIRUBINUR, KETONESUR, PROTEINUR, UROBILINOGEN, NITRITE, LEUKOCYTESUR Sepsis Labs: @LABRCNTIP (procalcitonin:4,lacticidven:4)  )No results found for this or any previous visit (from the past 240 hour(s)).    Studies: Mr Brain 47 Contrast  Result Date: 10/19/2017 CLINICAL DATA:  32 year old male with unexplained severe bilateral lower extremity pain and weakness. EXAM: MRI HEAD WITHOUT CONTRAST TECHNIQUE: Multiplanar, multiecho pulse sequences of the brain and surrounding structures were obtained without intravenous contrast. COMPARISON:  Total spine MRI 10/18/2017. FINDINGS: Brain: Normal cerebral volume. No restricted diffusion to suggest acute infarction. No midline shift, mass effect, evidence of mass lesion, ventriculomegaly, extra-axial collection or acute intracranial hemorrhage. Normal basilar cisterns. Cervicomedullary junction and pituitary are within normal limits. Wallace Cullens and white matter signal is within normal limits throughout the brain. No encephalomalacia or chronic cerebral blood products identified. Vascular: Major intracranial vascular flow voids are preserved. Skull and upper cervical spine: Normal visible upper cervical spine. Visualized bone marrow signal is within normal limits. Sinuses/Orbits: Bilateral orbits soft tissues appear normal, mild motion artifact. There is only trace paranasal sinus mucosal thickening. Other: Bilateral mastoid air cells are clear. Visible internal auditory structures appear normal. Scalp and face soft tissues appear negative. IMPRESSION: Noncontrast MRI appearance of the brain is normal. Electronically Signed   By: Odessa Fleming M.D.   On: 10/19/2017 10:22   Mr Lumbar Spine Wo Contrast  Result Date: 10/18/2017 CLINICAL DATA:  Back  pain and bilateral leg pain with numbness and tingling in both legs. EXAM: MRI LUMBAR SPINE WITHOUT CONTRAST TECHNIQUE: Multiplanar, multisequence MR imaging of the lumbar spine was performed. No intravenous contrast was administered. COMPARISON:  Lumbar radiographs dated 10/18/2017 FINDINGS: Segmentation:  Standard. Alignment:  Physiologic. Vertebrae:  No fracture, evidence of discitis, or bone lesion. Conus medullaris and cauda equina: Conus extends to the L1-2 level. Conus and cauda equina appear normal. Paraspinal and other soft tissues: Negative. Disc levels: T10-11: Small central soft disc protrusion indents the ventral aspect of the spinal cord, incompletely visualized on this lumbar MRI. No appreciable myelopathy. T11-12: Normal. T12-L1: Slight disc desiccation.  Otherwise normal. L1-2: Normal. L2-3: Normal. L3-4: Small disc bulges into both neural foramina, left greater than right. However, the L3 nerves exit without impingement. L4-5: Normal. L5-S1: Slight disc desiccation. Tiny central disc bulge with no neural impingement. 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 No appreciable acute abnormalities. Electronically Signed   By: Francene Boyers M.D.   On: 10/18/2017 17:33   Mr Lumbar Spine W Contrast  Result Date: 10/18/2017 CLINICAL DATA:  Low back pain after bending. Bilateral lower extremity numbness and tingling. EXAM: MRI CERVICAL SPINE WITHOUT AND WITH CONTRAST MRI THORACIC SPINE WITHOUT AND WITH CONTRAST MRI LUMBAR SPINE WITH CONTRAST TECHNIQUE: Multiplanar and multiecho pulse sequences of the cervical spine, to include the craniocervical junction, and the thoracic spine were  obtained without and with intravenous contrast. Imaging of the lumbar spine was obtained with IV contrast only. CONTRAST:  20mL MULTIHANCE GADOBENATE DIMEGLUMINE 529 MG/ML IV SOLN COMPARISON:  Lumbar spine MRI earlier the same day FINDINGS: MRI CERVICAL SPINE FINDINGS  Alignment: Physiologic. Vertebrae: No fracture, evidence of discitis, or bone lesion. Cord: Normal signal and morphology. Posterior Fossa, vertebral arteries, paraspinal tissues: Visualized posterior fossa is normal. Vertebral artery flow voids are preserved. No prevertebral soft tissue swelling. Disc levels: C1-C2: Normal. C2-C3: Normal disc space and facets. No spinal canal or neuroforaminal stenosis. C3-C4: Normal disc space and facets. No spinal canal or neuroforaminal stenosis. C4-C5: Normal disc space and facets. No spinal canal or neuroforaminal stenosis. C5-C6: Small central disc protrusion effaces the ventral thecal sac. No significant spinal canal stenosis. C6-C7: Small central disc extrusion with superior migration effaces the ventral thecal sac. Mild spinal canal stenosis with flattening of the spinal cord but no signal change. Mild-to-moderate bilateral foraminal stenosis. C7-T1: Normal disc space and facets. No spinal canal or neuroforaminal stenosis. No abnormal contrast enhancement. MRI THORACIC SPINE FINDINGS Alignment: Normal Vertebrae: Normal Cord: Normal Paraspinal and other soft tissues: Normal Disc levels: There are small central disc protrusions at T6-7, T7-T8, T8-T9, T9-T10 and T10-T11. The largest of these are at T7-8 and T10-11. These efface the ventral thecal sac and indents the spinal cord but cause only mild spinal canal stenosis. The other disc levels are normal. No abnormal contrast enhancement. MRI LUMBAR SPINE FINDINGS Normal appearance of the conus medullaris and cauda equina without abnormal contrast enhancement. IMPRESSION: 1. No spinal cord or cauda equina signal abnormality or abnormal contrast enhancement. 2. Mild spinal canal stenosis at C6-7, T7-T8 and T10-11 with flattening of the ventral spinal cord but no signal change. 3. Mild disc protrusions at C5-6, T6-7, T8-9 and T9-10 without associated stenosis. Electronically Signed   By: Deatra RobinsonKevin  Herman M.D.   On: 10/18/2017 21:54     Mr Cervical Spine W Wo Contrast  Result Date: 10/18/2017 CLINICAL DATA:  Low back pain after bending. Bilateral lower extremity numbness and tingling. EXAM: MRI CERVICAL SPINE WITHOUT AND WITH CONTRAST MRI THORACIC SPINE WITHOUT AND WITH CONTRAST MRI LUMBAR SPINE WITH CONTRAST TECHNIQUE: Multiplanar and multiecho pulse sequences of the cervical spine, to include the craniocervical junction, and the thoracic spine were obtained without and with intravenous contrast. Imaging of the lumbar spine was obtained with IV contrast only. CONTRAST:  20mL MULTIHANCE GADOBENATE DIMEGLUMINE 529 MG/ML IV SOLN COMPARISON:  Lumbar spine MRI earlier the same day FINDINGS: MRI CERVICAL SPINE FINDINGS Alignment: Physiologic. Vertebrae: No fracture, evidence of discitis, or bone lesion. Cord: Normal signal and morphology. Posterior Fossa, vertebral arteries, paraspinal tissues: Visualized posterior fossa is normal. Vertebral artery flow voids are preserved. No prevertebral soft tissue swelling. Disc levels: C1-C2: Normal. C2-C3: Normal disc space and facets. No spinal canal or neuroforaminal stenosis. C3-C4: Normal disc space and facets. No spinal canal or neuroforaminal stenosis. C4-C5: Normal disc space and facets. No spinal canal or neuroforaminal stenosis. C5-C6: Small central disc protrusion effaces the ventral thecal sac. No significant spinal canal stenosis. C6-C7: Small central disc extrusion with superior migration effaces the ventral thecal sac. Mild spinal canal stenosis with flattening of the spinal cord but no signal change. Mild-to-moderate bilateral foraminal stenosis. C7-T1: Normal disc space and facets. No spinal canal or neuroforaminal stenosis. No abnormal contrast enhancement. MRI THORACIC SPINE FINDINGS Alignment: Normal Vertebrae: Normal Cord: Normal Paraspinal and other soft tissues: Normal Disc levels: There are small central  disc protrusions at T6-7, T7-T8, T8-T9, T9-T10 and T10-T11. The largest of these  are at T7-8 and T10-11. These efface the ventral thecal sac and indents the spinal cord but cause only mild spinal canal stenosis. The other disc levels are normal. No abnormal contrast enhancement. MRI LUMBAR SPINE FINDINGS Normal appearance of the conus medullaris and cauda equina without abnormal contrast enhancement. IMPRESSION: 1. No spinal cord or cauda equina signal abnormality or abnormal contrast enhancement. 2. Mild spinal canal stenosis at C6-7, T7-T8 and T10-11 with flattening of the ventral spinal cord but no signal change. 3. Mild disc protrusions at C5-6, T6-7, T8-9 and T9-10 without associated stenosis. Electronically Signed   By: Deatra Robinson M.D.   On: 10/18/2017 21:54   Mr Thoracic Spine W Wo Contrast  Result Date: 10/18/2017 CLINICAL DATA:  Low back pain after bending. Bilateral lower extremity numbness and tingling. EXAM: MRI CERVICAL SPINE WITHOUT AND WITH CONTRAST MRI THORACIC SPINE WITHOUT AND WITH CONTRAST MRI LUMBAR SPINE WITH CONTRAST TECHNIQUE: Multiplanar and multiecho pulse sequences of the cervical spine, to include the craniocervical junction, and the thoracic spine were obtained without and with intravenous contrast. Imaging of the lumbar spine was obtained with IV contrast only. CONTRAST:  20mL MULTIHANCE GADOBENATE DIMEGLUMINE 529 MG/ML IV SOLN COMPARISON:  Lumbar spine MRI earlier the same day FINDINGS: MRI CERVICAL SPINE FINDINGS Alignment: Physiologic. Vertebrae: No fracture, evidence of discitis, or bone lesion. Cord: Normal signal and morphology. Posterior Fossa, vertebral arteries, paraspinal tissues: Visualized posterior fossa is normal. Vertebral artery flow voids are preserved. No prevertebral soft tissue swelling. Disc levels: C1-C2: Normal. C2-C3: Normal disc space and facets. No spinal canal or neuroforaminal stenosis. C3-C4: Normal disc space and facets. No spinal canal or neuroforaminal stenosis. C4-C5: Normal disc space and facets. No spinal canal or  neuroforaminal stenosis. C5-C6: Small central disc protrusion effaces the ventral thecal sac. No significant spinal canal stenosis. C6-C7: Small central disc extrusion with superior migration effaces the ventral thecal sac. Mild spinal canal stenosis with flattening of the spinal cord but no signal change. Mild-to-moderate bilateral foraminal stenosis. C7-T1: Normal disc space and facets. No spinal canal or neuroforaminal stenosis. No abnormal contrast enhancement. MRI THORACIC SPINE FINDINGS Alignment: Normal Vertebrae: Normal Cord: Normal Paraspinal and other soft tissues: Normal Disc levels: There are small central disc protrusions at T6-7, T7-T8, T8-T9, T9-T10 and T10-T11. The largest of these are at T7-8 and T10-11. These efface the ventral thecal sac and indents the spinal cord but cause only mild spinal canal stenosis. The other disc levels are normal. No abnormal contrast enhancement. MRI LUMBAR SPINE FINDINGS Normal appearance of the conus medullaris and cauda equina without abnormal contrast enhancement. IMPRESSION: 1. No spinal cord or cauda equina signal abnormality or abnormal contrast enhancement. 2. Mild spinal canal stenosis at C6-7, T7-T8 and T10-11 with flattening of the ventral spinal cord but no signal change. 3. Mild disc protrusions at C5-6, T6-7, T8-9 and T9-10 without associated stenosis. Electronically Signed   By: Deatra Robinson M.D.   On: 10/18/2017 21:54   Ct Abdomen Pelvis W Contrast  Result Date: 10/18/2017 CLINICAL DATA:  Acute back pain, nausea, vomiting EXAM: CT ABDOMEN AND PELVIS WITH CONTRAST TECHNIQUE: Multidetector CT imaging of the abdomen and pelvis was performed using the standard protocol following bolus administration of intravenous contrast. CONTRAST:  ISOVUE-300 IOPAMIDOL (ISOVUE-300) INJECTION 61% COMPARISON:  None. FINDINGS: Lower chest: Lung bases are clear. No effusions. Heart is normal size. Hepatobiliary: Fatty infiltration of the liver. No focal  abnormality  or biliary ductal dilatation. Gallbladder unremarkable. Pancreas: No focal abnormality or ductal dilatation. Spleen: No focal abnormality.  Normal size. Adrenals/Urinary Tract: No adrenal abnormality. No focal renal abnormality. No stones or hydronephrosis. Urinary bladder is unremarkable. Stomach/Bowel: Stomach, large and small bowel grossly unremarkable. Appendix is normal. Vascular/Lymphatic: No evidence of aneurysm or adenopathy. Reproductive: No visible focal abnormality. Other: No free fluid or free air. Musculoskeletal: No acute bony abnormality. IMPRESSION: Mild fatty infiltration of the liver. No acute findings in the abdomen or pelvis. Electronically Signed   By: Charlett Nose M.D.   On: 10/18/2017 22:50    Scheduled Meds: . enoxaparin (LOVENOX) injection  40 mg Subcutaneous Q24H    Continuous Infusions:   LOS: 0 days     Briant Cedar, MD [facility]   If 7PM-7AM, please contact night-coverage [email or web address] Password [email or web address] 10/19/2017, 1:12 PM

## 2017-10-19 NOTE — Evaluation (Signed)
Physical Therapy Evaluation Patient Details Name: Ryan Hurst MRN: [id number] DOB: 1986-03-08 Today's Date: 10/19/2017   History of Present Illness  32 year old male with sudden onset of bilateral lower extremity weakness and left thigh pain after standing up from bent position, followed by rapid resolution of RLE weakness with persistence of LLE weakness  Clinical Impression  Pt admitted with above diagnosis. Pt currently with functional limitations due to the deficits listed below (see PT Problem List). Pt able to lift L leg in bed but not complete full range or take resistance, same with knee extension, and continues to lack df/ pf. Ambulated 100' with RW using UE's to take wt, L steppage gait to prevent L toe drag. Pt unable to safely ambulate without RW.  Pt will benefit from skilled PT to increase their independence and safety with mobility to allow discharge to the venue listed below.       Follow Up Recommendations Outpatient PT    Equipment Recommendations  Rolling walker with 5" wheels    Recommendations for Other Services       Precautions / Restrictions Precautions Precautions: Fall Restrictions Weight Bearing Restrictions: No      Mobility  Bed Mobility Overal bed mobility: Modified Independent             General bed mobility comments: pt able to come to EOB with increased effort moving LLE  Transfers Overall transfer level: Modified independent Equipment used: None             General transfer comment: pt able to stand with wt on R side (R lean)  Ambulation/Gait Ambulation/Gait assistance: Min guard Ambulation Distance (Feet): 100 Feet Assistive device: Rolling walker (2 wheeled) Gait Pattern/deviations: Step-through pattern;Decreased weight shift to left;Decreased step length - left;Steppage Gait velocity: decreased Gait velocity interpretation: Below normal speed for age/gender General Gait Details: pt with L steppage to prevent L  toe drag due to lack of L dorsiflexion.  Attempted gait without RW and pt was very unsafe, hopping on RLE due to inability to take full wt LLE.  Stairs            Wheelchair Mobility    Modified Rankin (Stroke Patients Only) Modified Rankin (Stroke Patients Only) Pre-Morbid Rankin Score: No symptoms Modified Rankin: Moderately severe disability     Balance Overall balance assessment: Needs assistance Sitting-balance support: No upper extremity supported Sitting balance-Leahy Scale: Normal     Standing balance support: No upper extremity supported Standing balance-Leahy Scale: Fair Standing balance comment: can maintain static standing without support but LOB with dynamic activity due to LLE weakness                             Pertinent Vitals/Pain Pain Assessment: Faces Faces Pain Scale: Hurts even more Pain Location: L quad and calf with mobility Pain Descriptors / Indicators: Cramping;Sharp Pain Intervention(s): Limited activity within patient's tolerance;Monitored during session    Home Living Family/patient expects to be discharged to:: Private residence Living Arrangements: Alone Available Help at Discharge: Friend(s);Available PRN/intermittently Type of Home: House Home Access: Stairs to enter   Entrance Stairs-Number of Steps: 3 Home Layout: One level Home Equipment: None      Prior Function Level of Independence: Independent         Comments: pt independent and works in [place] at a [employer]        Extremity/Trunk Assessment  Upper Extremity Assessment Upper Extremity Assessment: Overall WFL for tasks assessed    Lower Extremity Assessment Lower Extremity Assessment: LLE deficits/detail LLE Deficits / Details: hip flex 3+/5, hip abduction >3/5, hip adduction >3/5, knee ext 3+/5, ankle df 0/5, ankle pf 0/5. Quivering noted in L quad with knee extension (not present to same degree on R side). Pt c/o L  lateral quad pain with contraction LLE Sensation: WNL LLE Coordination: decreased gross motor    Cervical / Trunk Assessment Cervical / Trunk Assessment: Normal  Communication   Communication: No difficulties  Cognition Arousal/Alertness: Awake/alert Behavior During Therapy: WFL for tasks assessed/performed Overall Cognitive Status: Within Functional Limits for tasks assessed                                        General Comments General comments (skin integrity, edema, etc.): pt reported tightness and "grabbing" L calf during ambulation    Exercises     Assessment/Plan    PT Assessment Patient needs continued PT services  PT Problem List Decreased strength;Decreased balance;Decreased mobility;Decreased coordination;Decreased knowledge of use of DME;Decreased knowledge of precautions;Pain;Impaired tone       PT Treatment Interventions DME instruction;Gait training;Stair training;Functional mobility training;Therapeutic activities;Therapeutic exercise;Balance training;Neuromuscular re-education;Patient/family education    PT Goals (Current goals can be found in the Care Plan section)  Acute Rehab PT Goals Patient Stated Goal: find out what is wrong with L leg, return to work PT Goal Formulation: With patient Time For Goal Achievement: 11/02/17 Potential to Achieve Goals: Good    Frequency Min 4X/week   Barriers to discharge Decreased caregiver support      Co-evaluation               AM-PAC PT "6 Clicks" Daily Activity  Outcome Measure Difficulty turning over in bed (including adjusting bedclothes, sheets and blankets)?: None Difficulty moving from lying on back to sitting on the side of the bed? : None Difficulty sitting down on and standing up from a chair with arms (e.g., wheelchair, bedside commode, etc,.)?: A Little Help needed moving to and from a bed to chair (including a wheelchair)?: A Little Help needed walking in hospital room?: A  Little Help needed climbing 3-5 steps with a railing? : A Lot 6 Click Score: 19    End of Session Equipment Utilized During Treatment: Gait belt Activity Tolerance: Patient tolerated treatment well Patient left: in bed;with call bell/phone within reach;with family/visitor present Nurse Communication: Mobility status PT Visit Diagnosis: Unsteadiness on feet (R26.81);Difficulty in walking, not elsewhere classified (R26.2);Pain Pain - Right/Left: Left Pain - part of body: Leg    Time: 1014-1100 PT Time Calculation (min) (ACUTE ONLY): 46 min   Charges:   PT Evaluation $PT Eval Moderate Complexity: 1 Mod PT Treatments $Gait Training: 8-22 mins $Therapeutic Activity: 8-22 mins   PT G Codes:        Lyanne CoVictoria Nazire Fruth, PT  Acute Rehab Services  [phone]   Turkey[place]ia L Sahith Nurse 10/19/2017, 1:32 PM

## 2017-10-19 NOTE — Progress Notes (Signed)
Patient [facility], to 5w04. Alert oriented x4. Complains of pain in left thigh and lateral leg/calf area. Left leg unable to bend at knee, absent dorsiflex/plantarflex slightly lifts leg for turning in bed. Normal sensation left leg. Slightly wiggles toes. Skin warm dry intact. Given pain robaxin, tylenol and mobic for pain. Admission completed and oriented to room and falls precautions.

## 2017-10-19 NOTE — Progress Notes (Signed)
Reason for consult:   Subjective: Some improvement in weakness and pain. Still needs support to walk.    ROS: negative except above   Examination  Vital signs in last 24 hours: Temp:  [97.5 F (36.4 C)-98.3 F (36.8 C)] 97.6 F (36.4 C) (02/26 1700) Pulse Rate:  [72-86] 74 (02/26 1700) Resp:  [14-20] 20 (02/26 1700) BP: (121-149)/(54-89) 149/63 (02/26 1700) SpO2:  [96 %-100 %] 100 % (02/26 1700)  General: Not in distress, cooperative CVS: pulse-normal rate and rhythm RS: breathing comfortably Extremities: normal   Neuro: MS: Alert, oriented, follows commands CN: pupils equal and reactive,  EOMI, face symmetric, tongue midline, normal sensation over face, Motor: LE: 1/5 to dorsiflexion, 3/5 plantar flexion, knee flexion 3/5, 4/5 to knee extension, hip flexion was 4+/5. Right leg 5/5 B/L 5/5  Coordination: normal Gait: not tested  Basic Metabolic Panel: Recent Labs  Lab 10/18/17 2041  NA 139  K 3.8  CL 107  CO2 24  GLUCOSE 96  BUN 13  CREATININE 0.90  CALCIUM 8.9    CBC: Recent Labs  Lab 10/18/17 1810  WBC 7.9  HGB 15.8  HCT 44.6  MCV 89.0  PLT 246     Coagulation Studies: No results for input(s): LABPROT, INR in the last 72 hours.  Imaging Reviewed:     ASSESSMENT AND PLAN  Left leg weakness  MRI brain shows no acute stroke No myelopathy noted on MRI Spine requiring acute neurosurgical intevervention. PT/OT NSAIDs EMG/NCS as outpatient     Sushanth Aroor [facility] Pager Number [phone] For questions after 7pm please refer to AMION to reach the Neurologist on call

## 2017-10-20 ENCOUNTER — Inpatient Hospital Stay (HOSPITAL_COMMUNITY): Payer: Self-pay

## 2017-10-20 DIAGNOSIS — R609 Edema, unspecified: Secondary | ICD-10-CM

## 2017-10-20 LAB — CK: Total CK: 174 U/L (ref 49–397)

## 2017-10-20 LAB — TSH: TSH: 2.453 u[IU]/mL (ref 0.350–4.500)

## 2017-10-20 MED ORDER — METHOCARBAMOL 750 MG PO TABS: 750.0000 mg | ORAL_TABLET | Freq: Three times a day (TID) | ORAL | 3 refills | Status: AC | PRN

## 2017-10-20 MED ORDER — GADOBENATE DIMEGLUMINE 529 MG/ML IV SOLN
20.0000 mL | Freq: Once | INTRAVENOUS | Status: AC
  Administered 2017-10-20: 20 mL via INTRAVENOUS

## 2017-10-20 MED ORDER — MELOXICAM 7.5 MG PO TABS: 7.5000 mg | ORAL_TABLET | Freq: Two times a day (BID) | ORAL | 3 refills | Status: AC | PRN

## 2017-10-20 MED ORDER — MELOXICAM 7.5 MG PO TABS: 7.5000 mg | ORAL_TABLET | Freq: Two times a day (BID) | ORAL | 3 refills | Status: DC | PRN

## 2017-10-20 MED ORDER — TRAMADOL HCL 50 MG PO TABS: 50.0000 mg | ORAL_TABLET | Freq: Three times a day (TID) | ORAL | 0 refills | Status: AC | PRN

## 2017-10-20 MED ORDER — UNABLE TO FIND: 0 refills | Status: AC

## 2017-10-20 MED ORDER — METHOCARBAMOL 750 MG PO TABS: 750.0000 mg | ORAL_TABLET | Freq: Three times a day (TID) | ORAL | 3 refills | Status: DC

## 2017-10-20 NOTE — Progress Notes (Addendum)
Subjective: Patient states that his left leg is getting slightly stronger and continues to have more strength in the right leg.  Also states that he continues to get a hot electrical sensation down the back of his left leg when he tries to dorsiflex or plantarflex his ankle.  Continues to have a burning sensation on the posterior aspect of his left calf.  Exam: Vitals:   10/20/17 0313 10/20/17 0809  BP: 116/66 130/77  Pulse: 80 73  Resp: 14 16  Temp: 98 F (36.7 C) 97.9 F (36.6 C)  SpO2: 99% 99%    Physical Exam   HEENT-  Normocephalic, no lesions, without obvious abnormality.  Normal external eye and conjunctiva.   Cardiovascular- S1-S2 audible, pulses palpable throughout   Lungs-no rhonchi or wheezing noted, no excessive working breathing.  Saturations within normal limits Abdomen- All 4 quadrants palpated and nontender Extremities- Warm, dry and intact Musculoskeletal-no joint tenderness, sensation of burning on the back aspect of his left calf Skin-warm and dry, no hyperpigmentation, vitiligo, or suspicious lesions    Neuro:  Mental Status: Alert, oriented, thought content appropriate.  Speech fluent without evidence of aphasia.  Able to follow 3 step commands without difficulty. Cranial Nerves: II: ; Visual fields grossly normal,  III,IV, VI: ptosis not present, extra-ocular motions intact bilaterally pupils equal, round, reactive to light and accommodation V,VII: smile symmetric, facial light touch sensation normal bilaterally VIII: hearing normal bilaterally IX,X: uvula rises symmetrically XI: bilateral shoulder shrug XII: midline tongue extension Motor: Right : Upper extremity   5/5    Left:     Upper extremity   5/5  Lower extremity   5/5     lower extremity   see note Note- patient has 0/5 strength with dorsiflexion, plantar flexion, ankle inversion, ankle Eversion on the left.  4/5 strength with left knee flexion, 5/5 left knee extension, 5/5 with hip flexion and  extension, 4/5 with hip adduction and 5/5 with hip abduction Tone and bulk:normal tone throughout; no atrophy noted Sensory: Pinprick and light touch intact throughout, bilaterally--has noted subjective burning sensation in the posterior aspect of the calf for the left Deep Tendon Reflexes: No deep tendon reflexes bilateral ankles, knees with 1+ in the upper extremities Plantars: Right: downgoing   Left: downgoing Cerebellar: normal finger-to-nose,  and normal heel-to-shin test     Medications:  Scheduled: . enoxaparin (LOVENOX) injection  40 mg Subcutaneous Q24H    Pertinent Labs/Diagnostics: CK 147 TSH 2.453  Mr Brain Wo Contrast  Result Date: 10/19/2017  IMPRESSION: Noncontrast MRI appearance of the brain is normal. Electronically Signed   By: Odessa FlemingH  Hall M.D.   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 No appreciable acute abnormalities. Electronically Signed   By: Francene BoyersJames  Maxwell M.D.   On: 10/18/2017 17:33     Mr Cervical Spine W Wo Contrast and lumbar  Result Date: 10/18/2017 . IMPRESSION: 1. No spinal cord or cauda equina signal abnormality or abnormal contrast enhancement. 2. Mild spinal canal stenosis at C6-7, T7-T8 and T10-11 with flattening of the ventral spinal cord but no signal change. 3. Mild disc protrusions at C5-6, T6-7, T8-9 and T9-10 without associated stenosis. Electronically Signed   By: Deatra RobinsonKevin  Herman M.D.   On: 10/18/2017 21:54   Mr Thoracic Spine W Wo Contrast  Result Date: 10/18/2017 CLINICAL DATA:  Low back pain after bending. Bilateral lower extremity numbness and  tingling. EXAM: MRI CERVICAL SPINE . IMPRESSION: 1. No spinal cord or cauda equina signal abnormality or abnormal contrast enhancement. 2. Mild spinal canal stenosis at C6-7, T7-T8 and T10-11 with flattening of the ventral spinal cord but no signal change. 3. Mild disc protrusions at C5-6, T6-7,  T8-9 and T9-10 without associated stenosis. Electronically Signed   By: Deatra Robinson M.D.   On: 10/18/2017 21:54   Ct Abdomen Pelvis W Contrast  Result Date: 10/18/2017  IMPRESSION: Mild fatty infiltration of the liver. No acute findings in the abdomen or pelvis. Electronically Signed   By: Charlett Nose M.D.   On: 10/18/2017 22:50     Felicie Morn PA-C [facility] [phone]  Impression:  Left leg weakness  MRI brain shows no acute stroke No myelopathy noted on MRI Spine requiring acute neurosurgical intevervention. PT/OT NSAIDs Will obtain MRI of the lumbar sacral plexus EMG/NCS as outpatient    10/20/2017, 12:15 PM    NEUROHOSPITALIST ADDENDUM Seen and examined the patient today. Patient feels strength has increased, however still has foot drop. Reflexes diminished throughout, but given significant pain and involvement of  Only one leg, low suspicion this is gullian barre.  Check hepatitis panel, TSH, T4,  SPEP, ANA, ANCA, RF.  Agree with recommendations above. Reviewed MRI LS plexus- no compressive lesion. Mainly needs PT/OT and outpatient follow up.   Georgiana Spinner Aroor MD [facility] [phone]  If 7pm to 7am, please call on call as listed on AMION.

## 2017-10-20 NOTE — Progress Notes (Signed)
LLE venous duplex prelim: negative for DVT. Sherrill Mckamie Eunice, RDMS, RVT  

## 2017-10-20 NOTE — Progress Notes (Signed)
[facility]                                                                              Patient Demographics  Ryan Hurst, is a 31 y.o. male, DOB - [birth year], VCB:[id number]  Admit date - 10/18/2017   Admitting Physician Norval Morton, MD  Outpatient Primary MD for the patient is Patient, No Pcp Per  Outpatient specialists:   LOS - 1  days   Medical records reviewed and are as summarized below:    Chief Complaint  Patient presents with  . Back Pain  . Leg Pain       Brief summary   Ryan Valone [name] a 32 y.o.malewithoutsignificant pastmedical history;who presents with complaints of leg pain and weakness for 1 day while at work. Patient bent over to lift an object that was not heavy. Upon standing he reported having pain that started on the lateral aspect of L thigh, left leg and also his back. Thereafter, reports being unable to bear weight on the left leg with associated numbness sensation.Denied having any headache, fever, cough, shortness of breath, nausea, vomiting, abdominal pain, dysuria, urinary urgency, saddle anesthesia, recent falls/trauma, or tick bites.Pt works in a [employer] where he sometimes lifts heavy objects as well as lifts weight at [employer] (30lbs). Pt admitted for further management   Assessment & Plan    Principal Problem:   Weakness of left lower extremity -Per patient improving, still having burning sensation -MRI of the cervical/thoracic spine showed mild spinal canal stenosis at C6-7, T7-8, T10-11 with flattening of the ventral spinal cord.  Mild disc protrusions at C5-C6, T6-7, T8-9, T9-10 without any associated stenosis.  MRI of the brain and lumbar spine showed no acute findings requiring any new surgical intervention -Continue PT OT, neurology consulted, recommended MRI of the lumbar sacral plexus -Doppler ultrasound of the left lower extremity negative for DVT Also recommended  EMG/NCS with outpatient neurology CK 174, CRP less than 0.8, ESR 4, TSH 2.45   Code Status: Full CODE STATUS DVT Prophylaxis:  Lovenox  Family Communication: Discussed in detail with the patient, all imaging results, lab results explained to the patient, family member in the room   Disposition Plan:  Time Spent in minutes 25 minutes  Procedures:  MRI of the brain: Normal MRI of the cervical, thoracic spine showed no cauda equina, mild spinal canal stenosis C6-7 T7-8 and T10-11, mild disc protrusion at C5-C6, T6-7, T8-9, T9-10   Consultants:   Neurology  Antimicrobials:    None  Medications  Scheduled Meds: . enoxaparin (LOVENOX) injection  40 mg Subcutaneous Q24H   Continuous Infusions: PRN Meds:.acetaminophen **OR** acetaminophen (TYLENOL) oral liquid 160 mg/5 mL **OR** acetaminophen, meloxicam, methocarbamol, senna-docusate   Antibiotics   Anti-infectives (From admission, onward)   None        Subjective:   Ryan Hurst was seen and examined today.  Feels left leg is improving, strength wise however complaining of pain in the calf area. Patient denies dizziness, chest pain, shortness of breath, abdominal pain, N/V/D/C . No acute events overnight.    Objective:   Vitals:  10/20/17 0313 10/20/17 0809 10/20/17 1100 10/20/17 1315  BP: 116/66 130/77  (!) 112/53  Pulse: 80 73  73  Resp: '14 16  16  '$ Temp: 98 F (36.7 C) 97.9 F (36.6 C)  97.9 F (36.6 C)  TempSrc: Oral Oral  Oral  SpO2: 99% 99%  98%  Weight:   93.5 kg (206 lb 2.1 oz)   Height:   '5\' 6"'$  (1.676 m)    No intake or output data in the 24 hours ending 10/20/17 1324   Wt Readings from Last 3 Encounters:  10/20/17 93.5 kg (206 lb 2.1 oz)     Exam  General: Alert and oriented x 3, NAD  Eyes:   HEENT:  Atraumatic, normocephalic, normal oropharynx  Cardiovascular: S1 S2 auscultated,  Regular rate and rhythm.  Respiratory: Clear to auscultation bilaterally, no wheezing, rales or  rhonchi  Gastrointestinal: Soft, nontender, nondistended, + bowel sounds  Ext: no pedal edema bilaterally  Neuro: AAOx3, Cr N's II- XII, RUE, RLE 5/5, LUE 5/44mLLE 4/5  Musculoskeletal: No digital cyanosis, clubbing  Skin: No rashes  Psych: Normal affect and demeanor, alert and oriented x3    Data Reviewed:  I have personally reviewed following labs and imaging studies  Micro Results No results found for this or any previous visit (from the past 240 hour(s)).  Radiology Reports Dg Lumbar Spine Complete  Result Date: 10/18/2017 CLINICAL DATA:  Left lower back pain and left lower extremity numbness after falling at work yesterday. EXAM: LUMBAR SPINE - COMPLETE 4+ VIEW COMPARISON:  None in PACs FINDINGS: The lumbar vertebral bodies are preserved in height. The disc space heights are well maintained. There is no spondylolisthesis. The spinous processes are intact. No pars defects are observed. The pedicles and transverse processes are intact. The observed portions of the sacrum are normal. IMPRESSION: There is no acute or significant chronic bony abnormality of the lumbar spine. Electronically Signed   By: David  JMartiniqueM.D.   On: 10/18/2017 11:32   Mr Brain Wo Contrast  Result Date: 10/19/2017 CLINICAL DATA:  32year old male with unexplained severe bilateral lower extremity pain and weakness. EXAM: MRI HEAD WITHOUT CONTRAST TECHNIQUE: Multiplanar, multiecho pulse sequences of the brain and surrounding structures were obtained without intravenous contrast. COMPARISON:  Total spine MRI 10/18/2017. FINDINGS: Brain: Normal cerebral volume. No restricted diffusion to suggest acute infarction. No midline shift, mass effect, evidence of mass lesion, ventriculomegaly, extra-axial collection or acute intracranial hemorrhage. Normal basilar cisterns. Cervicomedullary junction and pituitary are within normal limits. GPearline Cablesand white matter signal is within normal limits throughout the brain. No  encephalomalacia or chronic cerebral blood products identified. Vascular: Major intracranial vascular flow voids are preserved. Skull and upper cervical spine: Normal visible upper cervical spine. Visualized bone marrow signal is within normal limits. Sinuses/Orbits: Bilateral orbits soft tissues appear normal, mild motion artifact. There is only trace paranasal sinus mucosal thickening. Other: Bilateral mastoid air cells are clear. Visible internal auditory structures appear normal. Scalp and face soft tissues appear negative. IMPRESSION: Noncontrast MRI appearance of the brain is normal. Electronically Signed   By: HGenevie AnnM.D.   On: 10/19/2017 10:22   Mr Lumbar Spine Wo Contrast  Result Date: 10/18/2017 CLINICAL DATA:  Back pain and bilateral leg pain with numbness and tingling in both legs. EXAM: MRI LUMBAR SPINE WITHOUT CONTRAST TECHNIQUE: Multiplanar, multisequence MR imaging of the lumbar spine was performed. No intravenous contrast was administered. COMPARISON:  Lumbar radiographs dated 10/18/2017 FINDINGS: Segmentation:  Standard. Alignment:  Physiologic. Vertebrae:  No fracture, evidence of discitis, or bone lesion. Conus medullaris and cauda equina: Conus extends to the L1-2 level. Conus and cauda equina appear normal. Paraspinal and other soft tissues: Negative. Disc levels: T10-11: Small central soft disc protrusion indents the ventral aspect of the spinal cord, incompletely visualized on this lumbar MRI. No appreciable myelopathy. T11-12: Normal. T12-L1: Slight disc desiccation.  Otherwise normal. L1-2: Normal. L2-3: Normal. L3-4: Small disc bulges into both neural foramina, left greater than right. However, the L3 nerves exit without impingement. L4-5: Normal. L5-S1: Slight disc desiccation. Tiny central disc bulge with no neural impingement. 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 No appreciable acute abnormalities. Electronically Signed   By:  Lorriane Shire M.D.   On: 10/18/2017 17:33   Mr Lumbar Spine W Contrast  Result Date: 10/18/2017 CLINICAL DATA:  Low back pain after bending. Bilateral lower extremity numbness and tingling. EXAM: MRI CERVICAL SPINE WITHOUT AND WITH CONTRAST MRI THORACIC SPINE WITHOUT AND WITH CONTRAST MRI LUMBAR SPINE WITH CONTRAST TECHNIQUE: Multiplanar and multiecho pulse sequences of the cervical spine, to include the craniocervical junction, and the thoracic spine were obtained without and with intravenous contrast. Imaging of the lumbar spine was obtained with IV contrast only. CONTRAST:  47m MULTIHANCE GADOBENATE DIMEGLUMINE 529 MG/ML IV SOLN COMPARISON:  Lumbar spine MRI earlier the same day FINDINGS: MRI CERVICAL SPINE FINDINGS Alignment: Physiologic. Vertebrae: No fracture, evidence of discitis, or bone lesion. Cord: Normal signal and morphology. Posterior Fossa, vertebral arteries, paraspinal tissues: Visualized posterior fossa is normal. Vertebral artery flow voids are preserved. No prevertebral soft tissue swelling. Disc levels: C1-C2: Normal. C2-C3: Normal disc space and facets. No spinal canal or neuroforaminal stenosis. C3-C4: Normal disc space and facets. No spinal canal or neuroforaminal stenosis. C4-C5: Normal disc space and facets. No spinal canal or neuroforaminal stenosis. C5-C6: Small central disc protrusion effaces the ventral thecal sac. No significant spinal canal stenosis. C6-C7: Small central disc extrusion with superior migration effaces the ventral thecal sac. Mild spinal canal stenosis with flattening of the spinal cord but no signal change. Mild-to-moderate bilateral foraminal stenosis. C7-T1: Normal disc space and facets. No spinal canal or neuroforaminal stenosis. No abnormal contrast enhancement. MRI THORACIC SPINE FINDINGS Alignment: Normal Vertebrae: Normal Cord: Normal Paraspinal and other soft tissues: Normal Disc levels: There are small central disc protrusions at T6-7, T7-T8, T8-T9,  T9-T10 and T10-T11. The largest of these are at T7-8 and T10-11. These efface the ventral thecal sac and indents the spinal cord but cause only mild spinal canal stenosis. The other disc levels are normal. No abnormal contrast enhancement. MRI LUMBAR SPINE FINDINGS Normal appearance of the conus medullaris and cauda equina without abnormal contrast enhancement. IMPRESSION: 1. No spinal cord or cauda equina signal abnormality or abnormal contrast enhancement. 2. Mild spinal canal stenosis at C6-7, T7-T8 and T10-11 with flattening of the ventral spinal cord but no signal change. 3. Mild disc protrusions at C5-6, T6-7, T8-9 and T9-10 without associated stenosis. Electronically Signed   By: KUlyses JarredM.D.   On: 10/18/2017 21:54   Mr Cervical Spine W Wo Contrast  Result Date: 10/18/2017 CLINICAL DATA:  Low back pain after bending. Bilateral lower extremity numbness and tingling. EXAM: MRI CERVICAL SPINE WITHOUT AND WITH CONTRAST MRI THORACIC SPINE WITHOUT AND WITH CONTRAST MRI LUMBAR SPINE WITH CONTRAST TECHNIQUE: Multiplanar and multiecho pulse sequences of the cervical spine, to include the craniocervical junction, and the thoracic spine were obtained without and with  intravenous contrast. Imaging of the lumbar spine was obtained with IV contrast only. CONTRAST:  41m MULTIHANCE GADOBENATE DIMEGLUMINE 529 MG/ML IV SOLN COMPARISON:  Lumbar spine MRI earlier the same day FINDINGS: MRI CERVICAL SPINE FINDINGS Alignment: Physiologic. Vertebrae: No fracture, evidence of discitis, or bone lesion. Cord: Normal signal and morphology. Posterior Fossa, vertebral arteries, paraspinal tissues: Visualized posterior fossa is normal. Vertebral artery flow voids are preserved. No prevertebral soft tissue swelling. Disc levels: C1-C2: Normal. C2-C3: Normal disc space and facets. No spinal canal or neuroforaminal stenosis. C3-C4: Normal disc space and facets. No spinal canal or neuroforaminal stenosis. C4-C5: Normal disc space  and facets. No spinal canal or neuroforaminal stenosis. C5-C6: Small central disc protrusion effaces the ventral thecal sac. No significant spinal canal stenosis. C6-C7: Small central disc extrusion with superior migration effaces the ventral thecal sac. Mild spinal canal stenosis with flattening of the spinal cord but no signal change. Mild-to-moderate bilateral foraminal stenosis. C7-T1: Normal disc space and facets. No spinal canal or neuroforaminal stenosis. No abnormal contrast enhancement. MRI THORACIC SPINE FINDINGS Alignment: Normal Vertebrae: Normal Cord: Normal Paraspinal and other soft tissues: Normal Disc levels: There are small central disc protrusions at T6-7, T7-T8, T8-T9, T9-T10 and T10-T11. The largest of these are at T7-8 and T10-11. These efface the ventral thecal sac and indents the spinal cord but cause only mild spinal canal stenosis. The other disc levels are normal. No abnormal contrast enhancement. MRI LUMBAR SPINE FINDINGS Normal appearance of the conus medullaris and cauda equina without abnormal contrast enhancement. IMPRESSION: 1. No spinal cord or cauda equina signal abnormality or abnormal contrast enhancement. 2. Mild spinal canal stenosis at C6-7, T7-T8 and T10-11 with flattening of the ventral spinal cord but no signal change. 3. Mild disc protrusions at C5-6, T6-7, T8-9 and T9-10 without associated stenosis. Electronically Signed   By: KUlyses JarredM.D.   On: 10/18/2017 21:54   Mr Thoracic Spine W Wo Contrast  Result Date: 10/18/2017 CLINICAL DATA:  Low back pain after bending. Bilateral lower extremity numbness and tingling. EXAM: MRI CERVICAL SPINE WITHOUT AND WITH CONTRAST MRI THORACIC SPINE WITHOUT AND WITH CONTRAST MRI LUMBAR SPINE WITH CONTRAST TECHNIQUE: Multiplanar and multiecho pulse sequences of the cervical spine, to include the craniocervical junction, and the thoracic spine were obtained without and with intravenous contrast. Imaging of the lumbar spine was  obtained with IV contrast only. CONTRAST:  27mMULTIHANCE GADOBENATE DIMEGLUMINE 529 MG/ML IV SOLN COMPARISON:  Lumbar spine MRI earlier the same day FINDINGS: MRI CERVICAL SPINE FINDINGS Alignment: Physiologic. Vertebrae: No fracture, evidence of discitis, or bone lesion. Cord: Normal signal and morphology. Posterior Fossa, vertebral arteries, paraspinal tissues: Visualized posterior fossa is normal. Vertebral artery flow voids are preserved. No prevertebral soft tissue swelling. Disc levels: C1-C2: Normal. C2-C3: Normal disc space and facets. No spinal canal or neuroforaminal stenosis. C3-C4: Normal disc space and facets. No spinal canal or neuroforaminal stenosis. C4-C5: Normal disc space and facets. No spinal canal or neuroforaminal stenosis. C5-C6: Small central disc protrusion effaces the ventral thecal sac. No significant spinal canal stenosis. C6-C7: Small central disc extrusion with superior migration effaces the ventral thecal sac. Mild spinal canal stenosis with flattening of the spinal cord but no signal change. Mild-to-moderate bilateral foraminal stenosis. C7-T1: Normal disc space and facets. No spinal canal or neuroforaminal stenosis. No abnormal contrast enhancement. MRI THORACIC SPINE FINDINGS Alignment: Normal Vertebrae: Normal Cord: Normal Paraspinal and other soft tissues: Normal Disc levels: There are small central disc protrusions at T6-7, T7-T8,  T8-T9, T9-T10 and T10-T11. The largest of these are at T7-8 and T10-11. These efface the ventral thecal sac and indents the spinal cord but cause only mild spinal canal stenosis. The other disc levels are normal. No abnormal contrast enhancement. MRI LUMBAR SPINE FINDINGS Normal appearance of the conus medullaris and cauda equina without abnormal contrast enhancement. IMPRESSION: 1. No spinal cord or cauda equina signal abnormality or abnormal contrast enhancement. 2. Mild spinal canal stenosis at C6-7, T7-T8 and T10-11 with flattening of the ventral  spinal cord but no signal change. 3. Mild disc protrusions at C5-6, T6-7, T8-9 and T9-10 without associated stenosis. Electronically Signed   By: Ulyses Jarred M.D.   On: 10/18/2017 21:54   Ct Abdomen Pelvis W Contrast  Result Date: 10/18/2017 CLINICAL DATA:  Acute back pain, nausea, vomiting EXAM: CT ABDOMEN AND PELVIS WITH CONTRAST TECHNIQUE: Multidetector CT imaging of the abdomen and pelvis was performed using the standard protocol following bolus administration of intravenous contrast. CONTRAST:  143m ISOVUE-300 IOPAMIDOL (ISOVUE-300) INJECTION 61% COMPARISON:  None. FINDINGS: Lower chest: Lung bases are clear. No effusions. Heart is normal size. Hepatobiliary: Fatty infiltration of the liver. No focal abnormality or biliary ductal dilatation. Gallbladder unremarkable. Pancreas: No focal abnormality or ductal dilatation. Spleen: No focal abnormality.  Normal size. Adrenals/Urinary Tract: No adrenal abnormality. No focal renal abnormality. No stones or hydronephrosis. Urinary bladder is unremarkable. Stomach/Bowel: Stomach, large and small bowel grossly unremarkable. Appendix is normal. Vascular/Lymphatic: No evidence of aneurysm or adenopathy. Reproductive: No visible focal abnormality. Other: No free fluid or free air. Musculoskeletal: No acute bony abnormality. IMPRESSION: Mild fatty infiltration of the liver. No acute findings in the abdomen or pelvis. Electronically Signed   By: KRolm BaptiseM.D.   On: 10/18/2017 22:50    Lab Data:  CBC: Recent Labs  Lab 10/18/17 1810  WBC 7.9  HGB 15.8  HCT 44.6  MCV 89.0  PLT 2716  Basic Metabolic Panel: Recent Labs  Lab 10/18/17 2041  NA 139  K 3.8  CL 107  CO2 24  GLUCOSE 96  BUN 13  CREATININE 0.90  CALCIUM 8.9   GFR: Estimated Creatinine Clearance: 125 mL/min (by C-G formula based on SCr of 0.9 mg/dL). Liver Function Tests: Recent Labs  Lab 10/18/17 2041  AST 23  ALT 25  ALKPHOS 69  BILITOT 0.7  PROT 8.2*  ALBUMIN 4.4   No  results for input(s): LIPASE, AMYLASE in the last 168 hours. No results for input(s): AMMONIA in the last 168 hours. Coagulation Profile: No results for input(s): INR, PROTIME in the last 168 hours. Cardiac Enzymes: Recent Labs  Lab 10/20/17 0952  CKTOTAL 174   BNP (last 3 results) No results for input(s): PROBNP in the last 8760 hours. HbA1C: No results for input(s): HGBA1C in the last 72 hours. CBG: No results for input(s): GLUCAP in the last 168 hours. Lipid Profile: No results for input(s): CHOL, HDL, LDLCALC, TRIG, CHOLHDL, LDLDIRECT in the last 72 hours. Thyroid Function Tests: Recent Labs    10/20/17 0952  TSH 2.453   Anemia Panel: No results for input(s): VITAMINB12, FOLATE, FERRITIN, TIBC, IRON, RETICCTPCT in the last 72 hours. Urine analysis: No results found for: COLORURINE, APPEARANCEUR, LABSPEC, PHURINE, GLUCOSEU, HGBUR, BILIRUBINUR, KETONESUR, PROTEINUR, UROBILINOGEN, NITRITE, LEUKOCYTESUR   Kenzel Ruesch M.D. [facility] 10/20/2017, 1:24 PM  Pager: [phone] Between 7am to 7pm - call Pager - [phone]  After 7pm go to [email or web address] - password [email or web address]  Call night coverage person covering after  7pm

## 2017-10-20 NOTE — Care Management Note (Addendum)
Case Management Note  Patient Details  Name: Buckner MaltaJuan Carlos Perez Morales MRN: [id number] Date of Birth: 12-Nov-1985  Subjective/Objective:      Left thigh pain and weakness              Action/Plan: NCM spoke to pt and this should be worker's comp. Did not see info about Worker's Comp. Pt contacted his employer and application has not been started. Contacted [facility] for RW to be delivered to room prior to admission. Provided pt with information on free clinics in [place]. Provided [email or web address] for Mobic $6 and [facility]12 [facility]   Pt requesting [facility]ed [facility] Attn Julie Shell #[phone] fax [phone]. Sent request to medical records. Pt completed PHI consent form.   Expected Discharge Date:  10/20/17               Expected Discharge Plan:  Home/Self Care  In-House Referral:  NA  Discharge planning Services  CM Consult  Post Acute Care Choice:  NA Choice offered to:  NA  DME Arranged:  Walker rolling DME Agency:  [facility]  HH Arranged:  NA HH Agency:  NA  Status of Service:  Completed, signed off  If discussed at Long Length of Stay Meetings, dates discussed:    Additional Comments:  Elliot CousinShavis, Greenlee Ancheta Ellen, RN 10/20/2017, 10:50 AM

## 2017-10-20 NOTE — Progress Notes (Signed)
PT Cancellation Note  Patient Details Name: Ryan MaltaJuan Carlos Perez Hurst MRN: [id number] DOB: [birth year]   Cancelled Treatment:    Reason Eval/Treat Not Completed: (P) Other (comment)(Pt in shower ) PT waited 20 minutes and still in shower. PT will follow back for treatment tomorrow.  Ryan Hurst PT, DPT Acute Rehabilitation  [phone] Pager [phone]     Ryan Hurst 10/20/2017, 4:45 PM

## 2017-10-21 NOTE — Progress Notes (Signed)
Nsg Discharge Note  Admit Date:  10/18/2017 Discharge date: 10/21/2017   Ryan Hurst Ryan Hurst Hurst to be D/C'd Home per MD order.  AVS completed.  Copy for chart, and copy for patient signed, and dated. Patient/caregiver able to verbalize understanding.  Discharge Medication: Allergies as of 10/21/2017   No Known Allergies     Medication List    TAKE these medications   meloxicam 7.5 MG tablet Commonly known as:  MOBIC Take 1 tablet (7.5 mg total) by mouth 2 (two) times daily as needed for pain.   methocarbamol 750 MG tablet Commonly known as:  ROBAXIN Take 1 tablet (750 mg total) by mouth every 8 (eight) hours as needed for muscle spasms. What changed:  when to take this   traMADol 50 MG tablet Commonly known as:  ULTRAM Take 1 tablet (50 mg total) by mouth every 8 (eight) hours as needed for moderate pain or severe pain.   UNABLE TO FIND Outpatient Physical therapy  Diagnosis: Neuropathy, lower extremity numbness            Durable Medical Equipment  (From admission, onward)        Start     Ordered   10/20/17 0959  For home use only DME Walker rolling  Once    Question:  Patient needs a walker to treat with the following condition  Answer:  Leg weakness   10/20/17 0959      Discharge Assessment: Vitals:   10/21/17 0438 10/21/17 0751  BP: 120/75 124/61  Pulse: 68 63  Resp: 18 18  Temp: 98 F (36.7 C) 97.7 F (36.5 C)  SpO2: 99% 98%   Skin clean, dry and intact without evidence of skin break down, no evidence of skin tears noted. IV catheter discontinued intact. Site without signs and symptoms of complications - no redness or edema noted at insertion site, patient denies c/o pain - only slight tenderness at site.  Dressing with slight pressure applied.  D/c Instructions-Education: Discharge instructions given to patient/family with verbalized understanding. D/c education completed with patient/family including follow up instructions, medication list,  d/c activities limitations if indicated, with other d/c instructions as indicated by MD - patient able to verbalize understanding, all questions fully answered. Patient instructed to return to ED, call 911, or call MD for any changes in condition.  Patient escorted via WC, and D/C home via private auto.  Ryan Danser Consuella Loselaine, RN 10/21/2017 12:01 PM

## 2017-10-21 NOTE — Discharge Summary (Signed)
Physician Discharge Summary   Patient ID: Ryan Hurst MRN: [id number] DOB/AGE: February 28, 1986 32 y.o.  Admit date: 10/18/2017 Discharge date: 10/21/2017  Primary Care Physician:  Patient, No Pcp Per   Recommendations for Outpatient Follow-up:  1. Follow up with PCP in 1-2 weeks 2. Please obtain BMP/CBC in one week  Home Health: Outpatient PT Equipment/Devices: DME rolling walker  Discharge Condition: stable  CODE STATUS: FULL  Diet recommendation: Regular diet   Discharge Diagnoses:    . Weakness of left lower extremity/neuropathy, unclear etiology  . Paresthesias   Consults:  Neurology   Allergies:  No Known Allergies   DISCHARGE MEDICATIONS: Allergies as of 10/21/2017   No Known Allergies     Medication List    TAKE these medications   meloxicam 7.5 MG tablet Commonly known as:  MOBIC Take 1 tablet (7.5 mg total) by mouth 2 (two) times daily as needed for pain.   methocarbamol 750 MG tablet Commonly known as:  ROBAXIN Take 1 tablet (750 mg total) by mouth every 8 (eight) hours as needed for muscle spasms. What changed:  when to take this   traMADol 50 MG tablet Commonly known as:  ULTRAM Take 1 tablet (50 mg total) by mouth every 8 (eight) hours as needed for moderate pain or severe pain.   UNABLE TO FIND Outpatient Physical therapy  Diagnosis: Neuropathy, lower extremity numbness            Durable Medical Equipment  (From admission, onward)        Start     Ordered   10/20/17 0959  For home use only DME Walker rolling  Once    Question:  Patient needs a walker to treat with the following condition  Answer:  Leg weakness   10/20/17 0959       Brief H and P: For complete details please refer to admission H and P, but in [name] a 32 y.o.malewithoutsignificant pastmedical history;who presents with complaints of leg pain and weaknessfor 1 day while at work.Patient bent over tolift an object that  wasnot heavy. Upon standing he reported havingpain that started on the lateral aspect of L thigh, left leg and also his back.Thereafter,reports being unable to bear weight on the left legwith associatednumbness sensation.[id number] any headache, fever, cough, shortness of breath, nausea, vomiting, abdominal pain, dysuria, urinary urgency, saddle anesthesia, recent falls/trauma, or tick bites.Pt works in a [employer] where he sometimes lifts heavy objects as well as lifts weight at [employer] (30lbs). Pt admitted for further management  Hospital Course:   Weakness of left lower extremity -Per patient improving, neurology was consulted -MRI of the cervical/thoracic spine showed mild spinal canal stenosis at C6-7, T7-8, T10-11 with flattening of the ventral spinal cord.  Mild disc protrusions at C5-C6, T6-7, T8-9, T9-10 without any associated stenosis. - MRI of the brain showed no acute stroke.  -  MRI lumbar spine showed no acute findings requiring any new surgical intervention - MRI of the sacral plexus was normal Doppler ultrasound of the left lower extremity negative for DVT Neurology recommended  EMG/NCS with outpatient neurology CK 174, CRP less than 0.8, ESR 4, TSH 2.45 Pediatrician recommended outpatient. Physical therapy  Day of Discharge S: Feeling better, no acute issues overnight  BP 124/61 (BP Location: Right Arm)   Pulse 63   Temp 97.7 F (36.5 C) (Oral)   Resp 18   Ht '5\' 6"'$  (1.676 m)   Wt 93.5 kg (206  lb 2.1 oz)   SpO2 98%   BMI 33.27 kg/m   Physical Exam: General: Alert and awake oriented x3 not in any acute distress. HEENT: anicteric sclera, pupils reactive to light and accommodation CVS: S1-S2 clear no murmur rubs or gallops Chest: clear to auscultation bilaterally, no wheezing rales or rhonchi Abdomen: soft nontender, nondistended, normal bowel sounds Extremities: no cyanosis, clubbing or edema noted bilaterally Neuro:no new  deficits  The results of significant diagnostics from this hospitalization (including imaging, microbiology, ancillary and laboratory) are listed below for reference.      Procedures/Studies:  Dg Lumbar Spine Complete  Result Date: 10/18/2017 CLINICAL DATA:  Left lower back pain and left lower extremity numbness after falling at work yesterday. EXAM: LUMBAR SPINE - COMPLETE 4+ VIEW COMPARISON:  None in PACs FINDINGS: The lumbar vertebral bodies are preserved in height. The disc space heights are well maintained. There is no spondylolisthesis. The spinous processes are intact. No pars defects are observed. The pedicles and transverse processes are intact. The observed portions of the sacrum are normal. IMPRESSION: There is no acute or significant chronic bony abnormality of the lumbar spine. Electronically Signed   By: Ryan  Hurst M.D.   On: 10/18/2017 11:32   Mr Brain Wo Contrast  Result Date: 10/19/2017 CLINICAL DATA:  32 year old male with unexplained severe bilateral lower extremity pain and weakness. EXAM: MRI HEAD WITHOUT CONTRAST TECHNIQUE: Multiplanar, multiecho pulse sequences of the brain and surrounding structures were obtained without intravenous contrast. COMPARISON:  Total spine MRI 10/18/2017. FINDINGS: Brain: Normal cerebral volume. No restricted diffusion to suggest acute infarction. No midline shift, mass effect, evidence of mass lesion, ventriculomegaly, extra-axial collection or acute intracranial hemorrhage. Normal basilar cisterns. Cervicomedullary junction and pituitary are within normal limits. Pearline Cables and white matter signal is within normal limits throughout the brain. No encephalomalacia or chronic cerebral blood products identified. Vascular: Major intracranial vascular flow voids are preserved. Skull and upper cervical spine: Normal visible upper cervical spine. Visualized bone marrow signal is within normal limits. Sinuses/Orbits: Bilateral orbits soft tissues appear normal,  mild motion artifact. There is only trace paranasal sinus mucosal thickening. Other: Bilateral mastoid air cells are clear. Visible internal auditory structures appear normal. Scalp and face soft tissues appear negative. IMPRESSION: Noncontrast MRI appearance of the brain is normal. Electronically Signed   By: Genevie Ann M.D.   On: 10/19/2017 10:22   Mr Lumbar Spine Wo Contrast  Result Date: 10/18/2017 CLINICAL DATA:  Back pain and bilateral leg pain with numbness and tingling in both legs. EXAM: MRI LUMBAR SPINE WITHOUT CONTRAST TECHNIQUE: Multiplanar, multisequence MR imaging of the lumbar spine was performed. No intravenous contrast was administered. COMPARISON:  Lumbar radiographs dated 10/18/2017 FINDINGS: Segmentation:  Standard. Alignment:  Physiologic. Vertebrae:  No fracture, evidence of discitis, or bone lesion. Conus medullaris and cauda equina: Conus extends to the L1-2 level. Conus and cauda equina appear normal. Paraspinal and other soft tissues: Negative. Disc levels: T10-11: Small central soft disc protrusion indents the ventral aspect of the spinal cord, incompletely visualized on this lumbar MRI. No appreciable myelopathy. T11-12: Normal. T12-L1: Slight disc desiccation.  Otherwise normal. L1-2: Normal. L2-3: Normal. L3-4: Small disc bulges into both neural foramina, left greater than right. However, the L3 nerves exit without impingement. L4-5: Normal. L5-S1: Slight disc desiccation. Tiny central disc bulge with no neural impingement. 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 No appreciable acute abnormalities. Electronically Signed   By: Lorriane Shire M.D.  On: 10/18/2017 17:33   Mr Lumbar Spine W Contrast  Result Date: 10/18/2017 CLINICAL DATA:  Low back pain after bending. Bilateral lower extremity numbness and tingling. EXAM: MRI CERVICAL SPINE WITHOUT AND WITH CONTRAST MRI THORACIC SPINE WITHOUT AND WITH CONTRAST MRI LUMBAR SPINE WITH  CONTRAST TECHNIQUE: Multiplanar and multiecho pulse sequences of the cervical spine, to include the craniocervical junction, and the thoracic spine were obtained without and with intravenous contrast. Imaging of the lumbar spine was obtained with IV contrast only. CONTRAST:  23m MULTIHANCE GADOBENATE DIMEGLUMINE 529 MG/ML IV SOLN COMPARISON:  Lumbar spine MRI earlier the same day FINDINGS: MRI CERVICAL SPINE FINDINGS Alignment: Physiologic. Vertebrae: No fracture, evidence of discitis, or bone lesion. Cord: Normal signal and morphology. Posterior Fossa, vertebral arteries, paraspinal tissues: Visualized posterior fossa is normal. Vertebral artery flow voids are preserved. No prevertebral soft tissue swelling. Disc levels: C1-C2: Normal. C2-C3: Normal disc space and facets. No spinal canal or neuroforaminal stenosis. C3-C4: Normal disc space and facets. No spinal canal or neuroforaminal stenosis. C4-C5: Normal disc space and facets. No spinal canal or neuroforaminal stenosis. C5-C6: Small central disc protrusion effaces the ventral thecal sac. No significant spinal canal stenosis. C6-C7: Small central disc extrusion with superior migration effaces the ventral thecal sac. Mild spinal canal stenosis with flattening of the spinal cord but no signal change. Mild-to-moderate bilateral foraminal stenosis. C7-T1: Normal disc space and facets. No spinal canal or neuroforaminal stenosis. No abnormal contrast enhancement. MRI THORACIC SPINE FINDINGS Alignment: Normal Vertebrae: Normal Cord: Normal Paraspinal and other soft tissues: Normal Disc levels: There are small central disc protrusions at T6-7, T7-T8, T8-T9, T9-T10 and T10-T11. The largest of these are at T7-8 and T10-11. These efface the ventral thecal sac and indents the spinal cord but cause only mild spinal canal stenosis. The other disc levels are normal. No abnormal contrast enhancement. MRI LUMBAR SPINE FINDINGS Normal appearance of the conus medullaris and cauda  equina without abnormal contrast enhancement. IMPRESSION: 1. No spinal cord or cauda equina signal abnormality or abnormal contrast enhancement. 2. Mild spinal canal stenosis at C6-7, T7-T8 and T10-11 with flattening of the ventral spinal cord but no signal change. 3. Mild disc protrusions at C5-6, T6-7, T8-9 and T9-10 without associated stenosis. Electronically Signed   By: KUlyses JarredM.D.   On: 10/18/2017 21:54   Mr Cervical Spine W Wo Contrast  Result Date: 10/18/2017 CLINICAL DATA:  Low back pain after bending. Bilateral lower extremity numbness and tingling. EXAM: MRI CERVICAL SPINE WITHOUT AND WITH CONTRAST MRI THORACIC SPINE WITHOUT AND WITH CONTRAST MRI LUMBAR SPINE WITH CONTRAST TECHNIQUE: Multiplanar and multiecho pulse sequences of the cervical spine, to include the craniocervical junction, and the thoracic spine were obtained without and with intravenous contrast. Imaging of the lumbar spine was obtained with IV contrast only. CONTRAST:  239mMULTIHANCE GADOBENATE DIMEGLUMINE 529 MG/ML IV SOLN COMPARISON:  Lumbar spine MRI earlier the same day FINDINGS: MRI CERVICAL SPINE FINDINGS Alignment: Physiologic. Vertebrae: No fracture, evidence of discitis, or bone lesion. Cord: Normal signal and morphology. Posterior Fossa, vertebral arteries, paraspinal tissues: Visualized posterior fossa is normal. Vertebral artery flow voids are preserved. No prevertebral soft tissue swelling. Disc levels: C1-C2: Normal. C2-C3: Normal disc space and facets. No spinal canal or neuroforaminal stenosis. C3-C4: Normal disc space and facets. No spinal canal or neuroforaminal stenosis. C4-C5: Normal disc space and facets. No spinal canal or neuroforaminal stenosis. C5-C6: Small central disc protrusion effaces the ventral thecal sac. No significant spinal canal stenosis. C6-C7: Small  central disc extrusion with superior migration effaces the ventral thecal sac. Mild spinal canal stenosis with flattening of the spinal cord  but no signal change. Mild-to-moderate bilateral foraminal stenosis. C7-T1: Normal disc space and facets. No spinal canal or neuroforaminal stenosis. No abnormal contrast enhancement. MRI THORACIC SPINE FINDINGS Alignment: Normal Vertebrae: Normal Cord: Normal Paraspinal and other soft tissues: Normal Disc levels: There are small central disc protrusions at T6-7, T7-T8, T8-T9, T9-T10 and T10-T11. The largest of these are at T7-8 and T10-11. These efface the ventral thecal sac and indents the spinal cord but cause only mild spinal canal stenosis. The other disc levels are normal. No abnormal contrast enhancement. MRI LUMBAR SPINE FINDINGS Normal appearance of the conus medullaris and cauda equina without abnormal contrast enhancement. IMPRESSION: 1. No spinal cord or cauda equina signal abnormality or abnormal contrast enhancement. 2. Mild spinal canal stenosis at C6-7, T7-T8 and T10-11 with flattening of the ventral spinal cord but no signal change. 3. Mild disc protrusions at C5-6, T6-7, T8-9 and T9-10 without associated stenosis. Electronically Signed   By: Ulyses Jarred M.D.   On: 10/18/2017 21:54   Mr Thoracic Spine W Wo Contrast  Result Date: 10/18/2017 CLINICAL DATA:  Low back pain after bending. Bilateral lower extremity numbness and tingling. EXAM: MRI CERVICAL SPINE WITHOUT AND WITH CONTRAST MRI THORACIC SPINE WITHOUT AND WITH CONTRAST MRI LUMBAR SPINE WITH CONTRAST TECHNIQUE: Multiplanar and multiecho pulse sequences of the cervical spine, to include the craniocervical junction, and the thoracic spine were obtained without and with intravenous contrast. Imaging of the lumbar spine was obtained with IV contrast only. CONTRAST:  69m MULTIHANCE GADOBENATE DIMEGLUMINE 529 MG/ML IV SOLN COMPARISON:  Lumbar spine MRI earlier the same day FINDINGS: MRI CERVICAL SPINE FINDINGS Alignment: Physiologic. Vertebrae: No fracture, evidence of discitis, or bone lesion. Cord: Normal signal and morphology. Posterior  Fossa, vertebral arteries, paraspinal tissues: Visualized posterior fossa is normal. Vertebral artery flow voids are preserved. No prevertebral soft tissue swelling. Disc levels: C1-C2: Normal. C2-C3: Normal disc space and facets. No spinal canal or neuroforaminal stenosis. C3-C4: Normal disc space and facets. No spinal canal or neuroforaminal stenosis. C4-C5: Normal disc space and facets. No spinal canal or neuroforaminal stenosis. C5-C6: Small central disc protrusion effaces the ventral thecal sac. No significant spinal canal stenosis. C6-C7: Small central disc extrusion with superior migration effaces the ventral thecal sac. Mild spinal canal stenosis with flattening of the spinal cord but no signal change. Mild-to-moderate bilateral foraminal stenosis. C7-T1: Normal disc space and facets. No spinal canal or neuroforaminal stenosis. No abnormal contrast enhancement. MRI THORACIC SPINE FINDINGS Alignment: Normal Vertebrae: Normal Cord: Normal Paraspinal and other soft tissues: Normal Disc levels: There are small central disc protrusions at T6-7, T7-T8, T8-T9, T9-T10 and T10-T11. The largest of these are at T7-8 and T10-11. These efface the ventral thecal sac and indents the spinal cord but cause only mild spinal canal stenosis. The other disc levels are normal. No abnormal contrast enhancement. MRI LUMBAR SPINE FINDINGS Normal appearance of the conus medullaris and cauda equina without abnormal contrast enhancement. IMPRESSION: 1. No spinal cord or cauda equina signal abnormality or abnormal contrast enhancement. 2. Mild spinal canal stenosis at C6-7, T7-T8 and T10-11 with flattening of the ventral spinal cord but no signal change. 3. Mild disc protrusions at C5-6, T6-7, T8-9 and T9-10 without associated stenosis. Electronically Signed   By: KUlyses JarredM.D.   On: 10/18/2017 21:54   Ct Abdomen Pelvis W Contrast  Result Date: 10/18/2017 CLINICAL  DATA:  Acute back pain, nausea, vomiting EXAM: CT ABDOMEN AND  PELVIS WITH CONTRAST TECHNIQUE: Multidetector CT imaging of the abdomen and pelvis was performed using the standard protocol following bolus administration of intravenous contrast. CONTRAST:  1107m ISOVUE-300 IOPAMIDOL (ISOVUE-300) INJECTION 61% COMPARISON:  None. FINDINGS: Lower chest: Lung bases are clear. No effusions. Heart is normal size. Hepatobiliary: Fatty infiltration of the liver. No focal abnormality or biliary ductal dilatation. Gallbladder unremarkable. Pancreas: No focal abnormality or ductal dilatation. Spleen: No focal abnormality.  Normal size. Adrenals/Urinary Tract: No adrenal abnormality. No focal renal abnormality. No stones or hydronephrosis. Urinary bladder is unremarkable. Stomach/Bowel: Stomach, large and small bowel grossly unremarkable. Appendix is normal. Vascular/Lymphatic: No evidence of aneurysm or adenopathy. Reproductive: No visible focal abnormality. Other: No free fluid or free air. Musculoskeletal: No acute bony abnormality. IMPRESSION: Mild fatty infiltration of the liver. No acute findings in the abdomen or pelvis. Electronically Signed   By: KRolm BaptiseM.D.   On: 10/18/2017 22:50   Mr Sacrum Si Joints W Wo Contrast  Result Date: 10/20/2017 CLINICAL DATA:  Bilateral lower extremity weakness and pain. Electric shocks sensations radiating into the left leg. EXAM: MRI LUMBAR SPINE WITHOUT AND WITH CONTRAST TECHNIQUE: Multiplanar and multiecho pulse sequences of the lumbar spine were obtained without and with intravenous contrast. CONTRAST:  20 ml MULTIHANCE GADOBENATE DIMEGLUMINE 529 MG/ML IV SOLN COMPARISON:  None. FINDINGS: Bone marrow signal is normal throughout. The sacroiliac joints appear normal. The sacral nerve plexus is normal in appearance. No fluid collection or mass and impinging on sacral nerves is identified and no nerve sheath tumor is present. All imaged musculature is intact and normal appearance. No edema to suggest inflammatory process or denervation is  seen. Imaged intrapelvic contents appear normal. IMPRESSION: Normal examination.  No finding to explain the patient's symptoms. Electronically Signed   By: TInge RiseM.D.   On: 10/20/2017 14:48      LAB RESULTS: Basic Metabolic Panel: Recent Labs  Lab 10/18/17 2041  NA 139  K 3.8  CL 107  CO2 24  GLUCOSE 96  BUN 13  CREATININE 0.90  CALCIUM 8.9   Liver Function Tests: Recent Labs  Lab 10/18/17 2041  AST 23  ALT 25  ALKPHOS 69  BILITOT 0.7  PROT 8.2*  ALBUMIN 4.4   No results for input(s): LIPASE, AMYLASE in the last 168 hours. No results for input(s): AMMONIA in the last 168 hours. CBC: Recent Labs  Lab 10/18/17 1810  WBC 7.9  HGB 15.8  HCT 44.6  MCV 89.0  PLT 246   Cardiac Enzymes: Recent Labs  Lab 10/20/17 0952  CKTOTAL 174   BNP: Invalid input(s): POCBNP CBG: No results for input(s): GLUCAP in the last 168 hours.    Disposition and Follow-up: Discharge Instructions    Ambulatory referral to Neurology   Complete by:  As directed    An appointment is requested in approximately:2 Week(s): needs EMG testing, LE weakness, seen inpatient by neurology   Diet - low sodium heart healthy   Complete by:  As directed    Increase activity slowly   Complete by:  As directed        DIS[place] an appointment as soon as possible for a visit in 2 week(s).   Contact information: [address] [place] [phone]  Time coordinating discharge:  35 minutes  Signed:   Estill Cotta M.D. [facility] 10/21/2017, 10:37 AM Pager: [phone]

## 2017-10-21 NOTE — Care Management Note (Addendum)
Case Management Note  Patient Details  Name: Buckner MaltaJuan Carlos Perez Morales MRN: [id number] Date of Birth: 11/03/1985  Subjective/Objective:        Left thigh pain and weakness             Action/Plan:  Transition to home today.  Expected Discharge Date:  10/21/17               Expected Discharge Plan:  Home/Self Care  In-House Referral:  NA  Discharge planning Services  CM Consult  Post Acute Care Choice:  NA Choice offered to:  NA  DME Arranged:  Walker rolling DME Agency:  [facility]  HH Arranged:  NA HH Agency:  NA  Status of Service:  Completed, signed off  If discussed at Long Length of Stay Meetings, dates discussed:    Additional Comments:  Epifanio LeschesCole, Sumedh Shinsato Hudson, RN 10/21/2017, 11:24 AM

## 2017-10-21 NOTE — Progress Notes (Signed)
Physical Therapy Treatment Patient Details Name: Ryan MaltaJuan Carlos Perez Hurst MRN: [id number] DOB: 10/17/1985 Today's Date: 10/21/2017    History of Present Illness 32 year old male with sudden onset of bilateral lower extremity weakness and left thigh pain after standing up from bent position, followed by rapid resolution of RLE weakness with persistence of LLE weakness    PT Comments    Pt is showing progress towards goals. He ambulates and negotiates steps with min guard for safety. L steppage gait secondary to foot drop.  Continues to require RW for support with ambulation. Pt would benefit form continues skilled PT to increase safety with mobility and return to PLOF. Expected to d/c today with OP PT to follow up.    Follow Up Recommendations  Outpatient PT     Equipment Recommendations  Rolling walker with 5" wheels    Recommendations for Other Services       Precautions / Restrictions Precautions Precautions: Fall Restrictions Weight Bearing Restrictions: No    Mobility  Bed Mobility Overal bed mobility: Modified Independent             General bed mobility comments: pt sitting EOB on arrival  Transfers Overall transfer level: Modified independent Equipment used: None             General transfer comment: pt able to stand with wt on R side (R lean)  Ambulation/Gait Ambulation/Gait assistance: Min guard Ambulation Distance (Feet): 300 Feet Assistive device: Rolling walker (2 wheeled) Gait Pattern/deviations: Step-through pattern;Decreased weight shift to left;Decreased step length - left;Steppage;Antalgic Gait velocity: decreased Gait velocity interpretation: Below normal speed for age/gender General Gait Details: pt with L steppage to prevent L toe drag due to lack of L dorsiflexion. Gait quality decreased as pt fatigued.   Stairs Stairs: Yes   Stair Management: One rail Right;Step to pattern;Forwards Number of Stairs: 4 General stair comments:  Cues for technique to prevent LLE from catching on step.   Wheelchair Mobility    Modified Rankin (Stroke Patients Only) Modified Rankin (Stroke Patients Only) Pre-Morbid Rankin Score: No symptoms Modified Rankin: Moderate disability     Balance Overall balance assessment: Needs assistance Sitting-balance support: No upper extremity supported Sitting balance-Leahy Scale: Normal     Standing balance support: No upper extremity supported Standing balance-Leahy Scale: Fair Standing balance comment: can maintain static standing without support                            Cognition Arousal/Alertness: Awake/alert Behavior During Therapy: WFL for tasks assessed/performed Overall Cognitive Status: Within Functional Limits for tasks assessed                                        Exercises      General Comments        Pertinent Vitals/Pain Pain Assessment: Faces Faces Pain Scale: No hurt Pain Location: L quad and calf with mobility Pain Descriptors / Indicators: Cramping;Sharp Pain Intervention(s): Monitored during session;Limited activity within patient's tolerance    Home Living                      Prior Function            PT Goals (current goals can now be found in the care plan section) Acute Rehab PT Goals Patient Stated Goal: find out what is wrong  with L leg, return to work PT Goal Formulation: With patient Time For Goal Achievement: 11/02/17 Potential to Achieve Goals: Good Progress towards PT goals: Progressing toward goals    Frequency    Min 4X/week      PT Plan Current plan remains appropriate    Co-evaluation              AM-PAC PT "6 Clicks" Daily Activity  Outcome Measure  Difficulty turning over in bed (including adjusting bedclothes, sheets and blankets)?: None Difficulty moving from lying on back to sitting on the side of the bed? : None Difficulty sitting down on and standing up from a chair  with arms (e.g., wheelchair, bedside commode, etc,.)?: None Help needed moving to and from a bed to chair (including a wheelchair)?: A Little Help needed walking in hospital room?: A Little Help needed climbing 3-5 steps with a railing? : A Little 6 Click Score: 21    End of Session Equipment Utilized During Treatment: Gait belt Activity Tolerance: Patient tolerated treatment well Patient left: in bed;with call bell/phone within reach;with family/visitor present(sitting EOB) Nurse Communication: Mobility status PT Visit Diagnosis: Unsteadiness on feet (R26.81);Difficulty in walking, not elsewhere classified (R26.2);Pain Pain - Right/Left: Left Pain - part of body: Leg     Time: 1610-9604 PT Time Calculation (min) (ACUTE ONLY): 13 min  Charges:  $Gait Training: 8-22 mins                    G Codes:       Kallie Locks, [place] Pager [phone] Acute Rehab   Sheral Apley 10/21/2017, 10:42 AM

## 2019-09-11 IMAGING — MR MR SACRUM / SI JOINTS WO/W CM
6 of 8 series · 33 of 48 positions shown · IV contrast (multihance)
Comparison: None.

CLINICAL DATA: Bilateral lower extremity weakness and pain.
Electric shocks sensations radiating into the left leg.

EXAM:
MRI LUMBAR SPINE WITHOUT AND WITH CONTRAST
TECHNIQUE: Multiplanar and multiecho pulse sequences of the lumbar spine were
obtained without and with intravenous contrast.
CONTRAST:  20 ml MULTIHANCE GADOBENATE DIMEGLUMINE 529 MG/ML IV SOLN

[Series 4: T1 · oblique · 3.0mm · 0.39mm/px · 5 of 33 slices shown (1 of 3)]
[im 1/33]
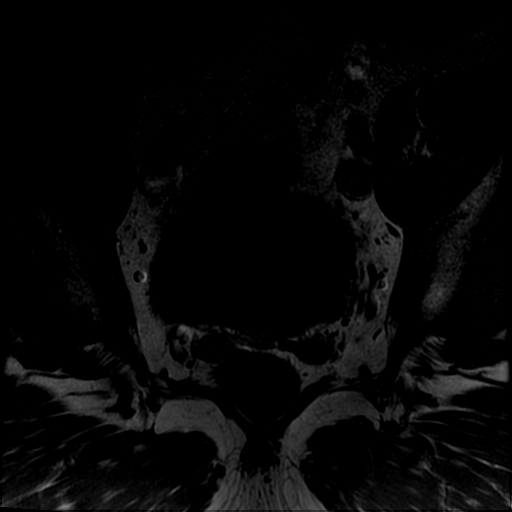
[im 9/33]
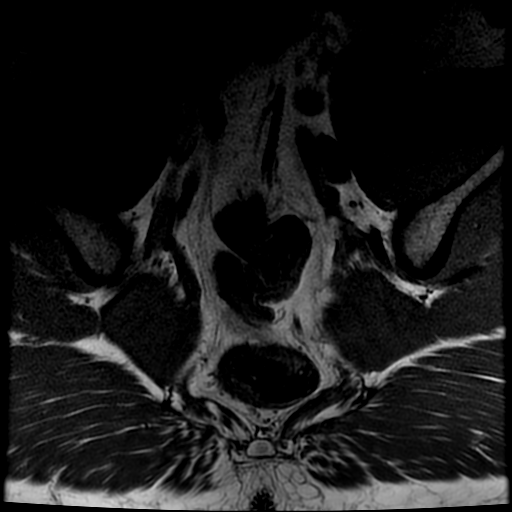
[im 17/33]
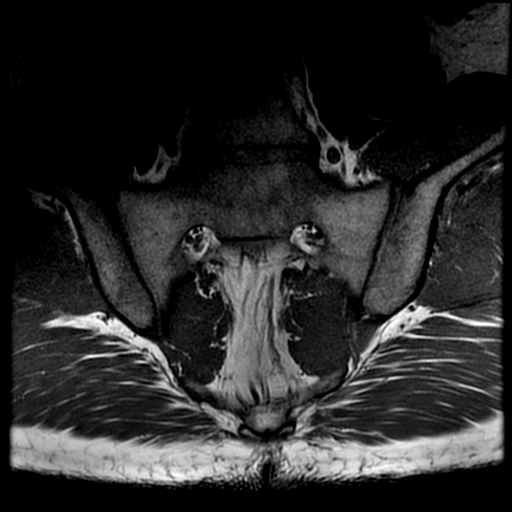
[im 25/33]
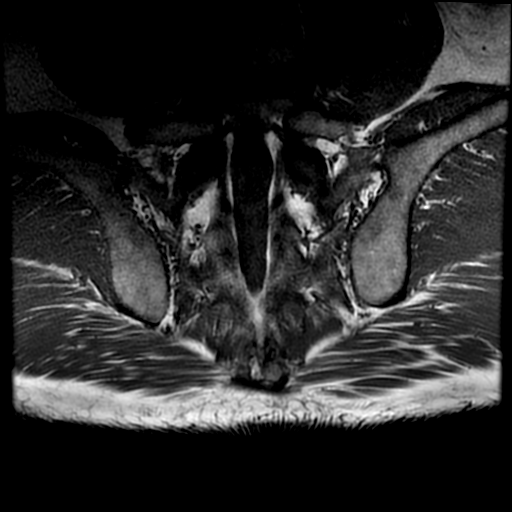
[im 33/33]
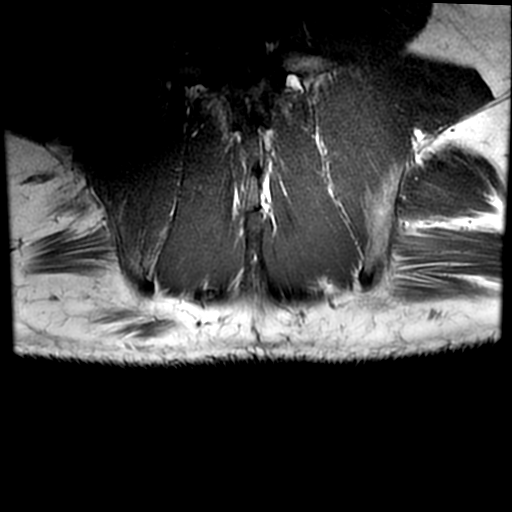

[Series 5: STIR · oblique · 3.0mm · 0.43mm/px · 3 of 33 slices shown]
[im 1/33]
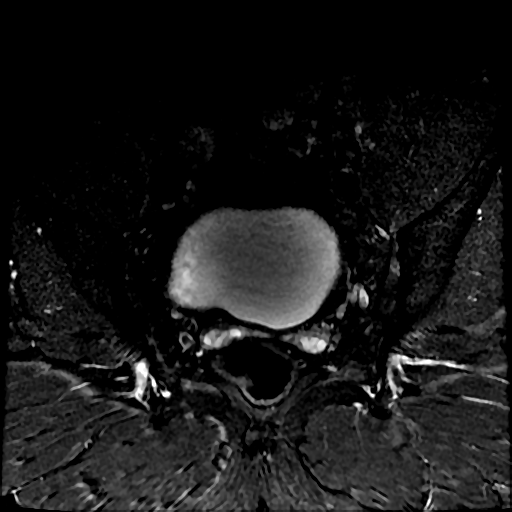
[im 9/33]
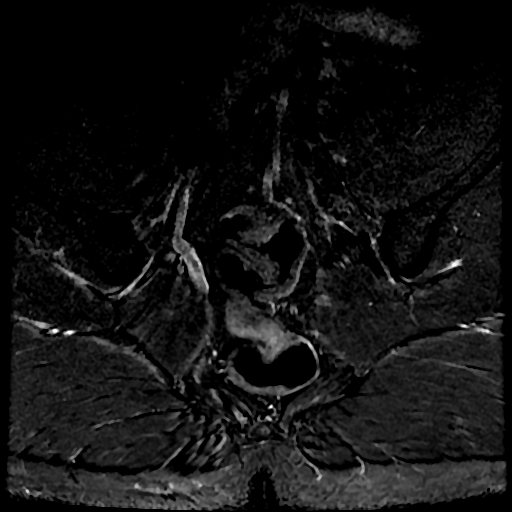
[im 17/33]
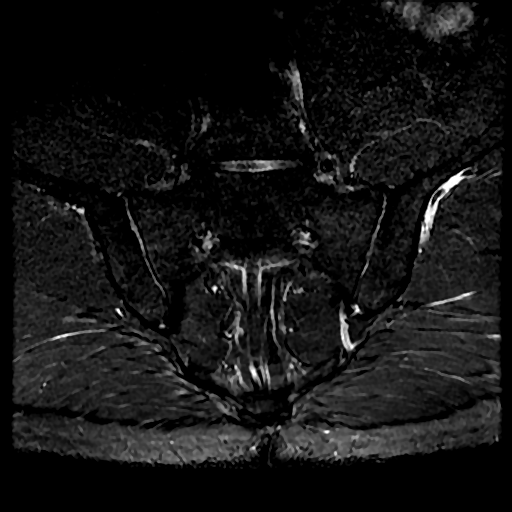

[Series 6: T1 · sagittal · 3.0mm · 0.62mm/px · 6 of 42 slices shown (2 of 3)]
[im 1/42]
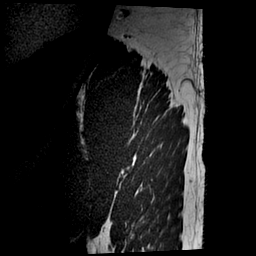
[im 9/42]
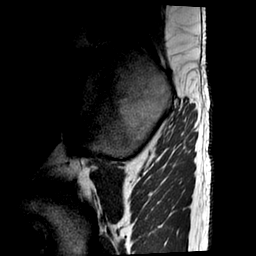
[im 17/42]
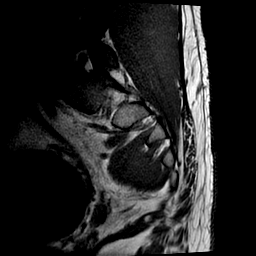
[im 25/42]
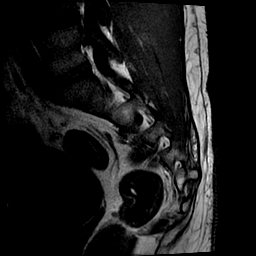
[im 33/42]
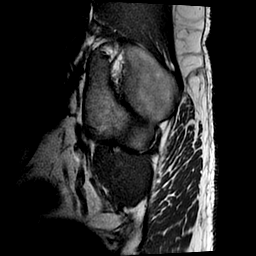
[im 42/42]
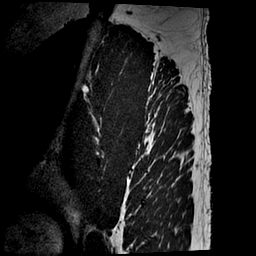

[Series 7: T1 · oblique · 3.0mm · 1.02mm/px · 7 of 50 slices shown (3 of 3)]
[im 1/50]
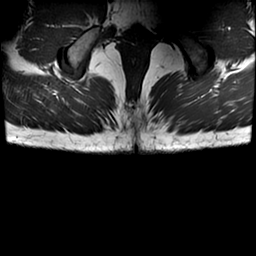
[im 9/50]
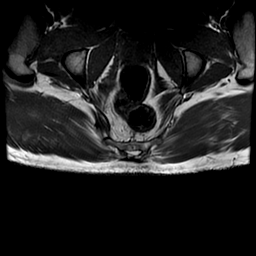
[im 17/50]
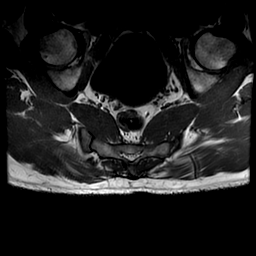
[im 25/50]
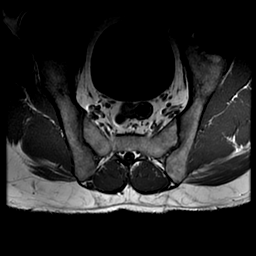
[im 33/50]
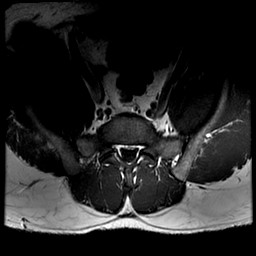
[im 41/50]
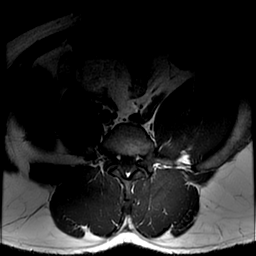
[im 50/50]
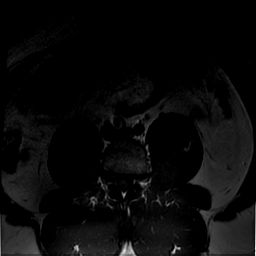

[Series 10: T1 fat-sat · oblique · non-contrast · 3.0mm · 1.02mm/px · 7 of 50 slices shown]
[im 1/50]
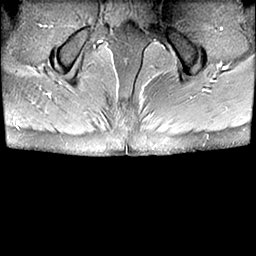
[im 9/50]
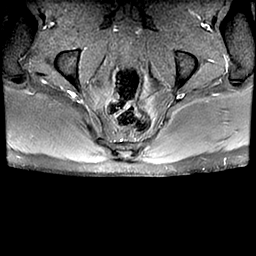
[im 17/50]
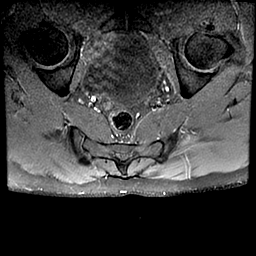
[im 25/50]
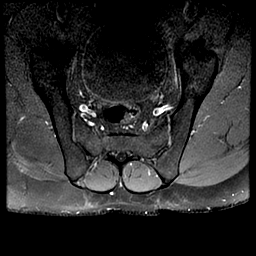
[im 33/50]
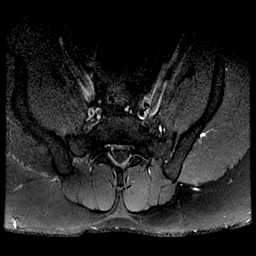
[im 41/50]
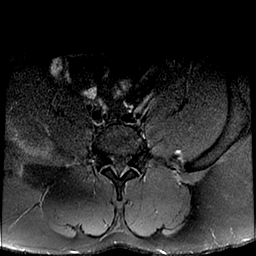
[im 50/50]
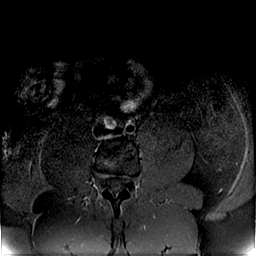

[Series 12: T1 fat-sat post-contrast · oblique · 3.0mm · 0.43mm/px · 5 of 33 slices shown]
[im 1/33]
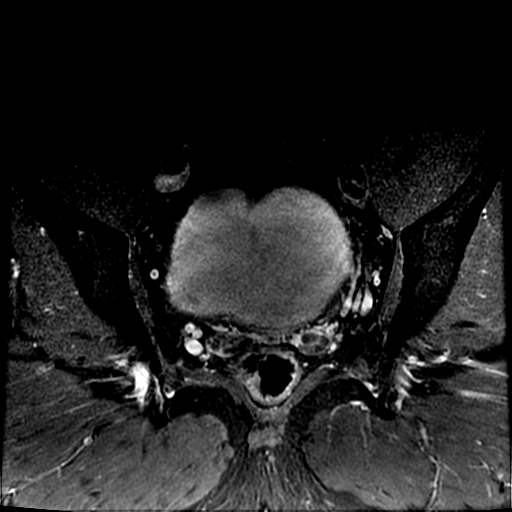
[im 9/33]
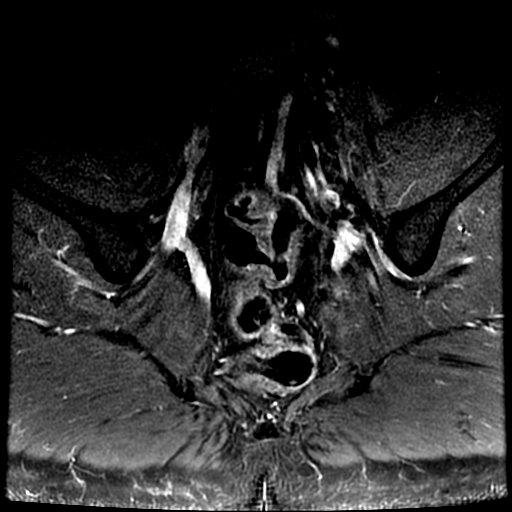
[im 17/33]
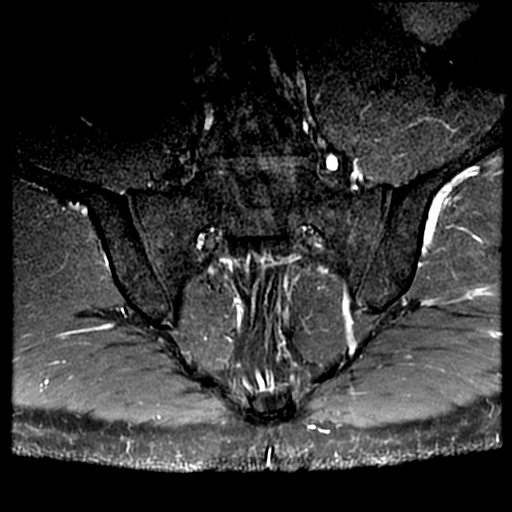
[im 25/33]
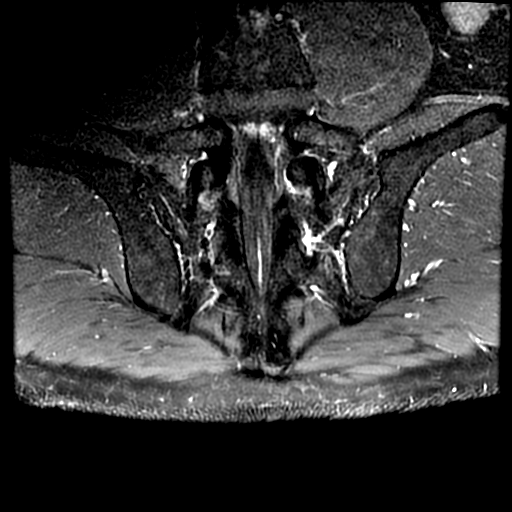
[im 33/33]
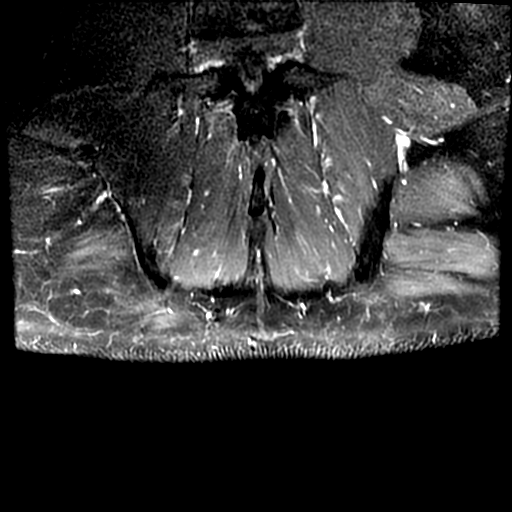

[33 of 48 positions shown; findings below may reference images not displayed]

FINDINGS: Bone marrow signal is normal throughout. The sacroiliac joints
appear normal. The sacral nerve plexus is normal in appearance. No
fluid collection or mass and impinging on sacral nerves is
identified and no nerve sheath tumor is present. All imaged
musculature is intact and normal appearance. No edema to suggest
inflammatory process or denervation is seen. Imaged intrapelvic
contents appear normal.
IMPRESSION: Normal examination.  No finding to explain the patient's symptoms.
